# Patient Record
Sex: Male | Born: 1995
Health system: Southern US, Community
[De-identification: ages and names within clinical notes are randomized; demographics above are authoritative.]

## PROBLEM LIST (undated history)

## (undated) DIAGNOSIS — L709 Acne, unspecified: Secondary | ICD-10-CM

## (undated) DIAGNOSIS — Z8619 Personal history of other infectious and parasitic diseases: Secondary | ICD-10-CM

## (undated) HISTORY — PX: NO PAST SURGERIES: SHX2092

## (undated) HISTORY — DX: Personal history of other infectious and parasitic diseases: Z86.19

## (undated) HISTORY — DX: Acne, unspecified: L70.9

---
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
## 2017-02-16 DIAGNOSIS — M79671 Pain in right foot: Secondary | ICD-10-CM | POA: Diagnosis not present

## 2017-06-05 DIAGNOSIS — Z23 Encounter for immunization: Secondary | ICD-10-CM | POA: Diagnosis not present

## 2017-08-31 DIAGNOSIS — H5213 Myopia, bilateral: Secondary | ICD-10-CM | POA: Diagnosis not present

## 2017-10-21 ENCOUNTER — Ambulatory Visit (INDEPENDENT_AMBULATORY_CARE_PROVIDER_SITE_OTHER): Payer: 59 | Admitting: Urgent Care

## 2017-10-21 ENCOUNTER — Encounter: Payer: Self-pay | Admitting: Urgent Care

## 2017-10-21 VITALS — BP 120/70 | HR 66 | Temp 98.2°F | Resp 16 | Ht 65.0 in | Wt 129.2 lb

## 2017-10-21 DIAGNOSIS — Z23 Encounter for immunization: Secondary | ICD-10-CM

## 2017-10-21 DIAGNOSIS — Z8249 Family history of ischemic heart disease and other diseases of the circulatory system: Secondary | ICD-10-CM

## 2017-10-21 DIAGNOSIS — Z1329 Encounter for screening for other suspected endocrine disorder: Secondary | ICD-10-CM | POA: Diagnosis not present

## 2017-10-21 DIAGNOSIS — Z113 Encounter for screening for infections with a predominantly sexual mode of transmission: Secondary | ICD-10-CM | POA: Diagnosis not present

## 2017-10-21 DIAGNOSIS — Z13228 Encounter for screening for other metabolic disorders: Secondary | ICD-10-CM

## 2017-10-21 DIAGNOSIS — R030 Elevated blood-pressure reading, without diagnosis of hypertension: Secondary | ICD-10-CM | POA: Diagnosis not present

## 2017-10-21 DIAGNOSIS — Z130 Encounter for screening for diseases of the blood and blood-forming organs and certain disorders involving the immune mechanism: Secondary | ICD-10-CM

## 2017-10-21 DIAGNOSIS — Z1321 Encounter for screening for nutritional disorder: Secondary | ICD-10-CM | POA: Diagnosis not present

## 2017-10-21 DIAGNOSIS — Z0000 Encounter for general adult medical examination without abnormal findings: Secondary | ICD-10-CM

## 2017-10-21 NOTE — Patient Instructions (Signed)
Health Maintenance, Male A healthy lifestyle and preventive care is important for your health and wellness. Ask your health care provider about what schedule of regular examinations is right for you. What should I know about weight and diet? Eat a Healthy Diet  Eat plenty of vegetables, fruits, whole grains, low-fat dairy products, and lean protein.  Do not eat a lot of foods high in solid fats, added sugars, or salt.  Maintain a Healthy Weight Regular exercise can help you achieve or maintain a healthy weight. You should:  Do at least 150 minutes of exercise each week. The exercise should increase your heart rate and make you sweat (moderate-intensity exercise).  Do strength-training exercises at least twice a week.  Watch Your Levels of Cholesterol and Blood Lipids  Have your blood tested for lipids and cholesterol every 5 years starting at 20 years of age. If you are at high risk for heart disease, you should start having your blood tested when you are 20 years old. You may need to have your cholesterol levels checked more often if: ? Your lipid or cholesterol levels are high. ? You are older than 20 years of age. ? You are at high risk for heart disease.  What should I know about cancer screening? Many types of cancers can be detected early and may often be prevented. Lung Cancer  You should be screened every year for lung cancer if: ? You are a current smoker who has smoked for at least 30 years. ? You are a former smoker who has quit within the past 15 years.  Talk to your health care provider about your screening options, when you should start screening, and how often you should be screened.  Colorectal Cancer  Routine colorectal cancer screening usually begins at 20 years of age and should be repeated every 5-10 years until you are 20 years old. You may need to be screened more often if early forms of precancerous polyps or small growths are found. Your health care provider  may recommend screening at an earlier age if you have risk factors for colon cancer.  Your health care provider may recommend using home test kits to check for hidden blood in the stool.  A small camera at the end of a tube can be used to examine your colon (sigmoidoscopy or colonoscopy). This checks for the earliest forms of colorectal cancer.  Prostate and Testicular Cancer  Depending on your age and overall health, your health care provider may do certain tests to screen for prostate and testicular cancer.  Talk to your health care provider about any symptoms or concerns you have about testicular or prostate cancer.  Skin Cancer  Check your skin from head to toe regularly.  Tell your health care provider about any new moles or changes in moles, especially if: ? There is a change in a mole's size, shape, or color. ? You have a mole that is larger than a pencil eraser.  Always use sunscreen. Apply sunscreen liberally and repeat throughout the day.  Protect yourself by wearing long sleeves, pants, a wide-brimmed hat, and sunglasses when outside.  What should I know about heart disease, diabetes, and high blood pressure?  If you are 18-39 years of age, have your blood pressure checked every 3-5 years. If you are 40 years of age or older, have your blood pressure checked every year. You should have your blood pressure measured twice-once when you are at a hospital or clinic, and once when   you are not at a hospital or clinic. Record the average of the two measurements. To check your blood pressure when you are not at a hospital or clinic, you can use: ? An automated blood pressure machine at a pharmacy. ? A home blood pressure monitor.  Talk to your health care provider about your target blood pressure.  If you are between 45-79 years old, ask your health care provider if you should take aspirin to prevent heart disease.  Have regular diabetes screenings by checking your fasting blood  sugar level. ? If you are at a normal weight and have a low risk for diabetes, have this test once every three years after the age of 45. ? If you are overweight and have a high risk for diabetes, consider being tested at a younger age or more often.  A one-time screening for abdominal aortic aneurysm (AAA) by ultrasound is recommended for men aged 65-75 years who are current or former smokers. What should I know about preventing infection? Hepatitis B If you have a higher risk for hepatitis B, you should be screened for this virus. Talk with your health care provider to find out if you are at risk for hepatitis B infection. Hepatitis C Blood testing is recommended for:  Everyone born from 1945 through 1965.  Anyone with known risk factors for hepatitis C.  Sexually Transmitted Diseases (STDs)  You should be screened each year for STDs including gonorrhea and chlamydia if: ? You are sexually active and are younger than 20 years of age. ? You are older than 20 years of age and your health care provider tells you that you are at risk for this type of infection. ? Your sexual activity has changed since you were last screened and you are at an increased risk for chlamydia or gonorrhea. Ask your health care provider if you are at risk.  Talk with your health care provider about whether you are at high risk of being infected with HIV. Your health care provider may recommend a prescription medicine to help prevent HIV infection.  What else can I do?  Schedule regular health, dental, and eye exams.  Stay current with your vaccines (immunizations).  Do not use any tobacco products, such as cigarettes, chewing tobacco, and e-cigarettes. If you need help quitting, ask your health care provider.  Limit alcohol intake to no more than 2 drinks per day. One drink equals 12 ounces of beer, 5 ounces of wine, or 1 ounces of hard liquor.  Do not use street drugs.  Do not share needles.  Ask your  health care provider for help if you need support or information about quitting drugs.  Tell your health care provider if you often feel depressed.  Tell your health care provider if you have ever been abused or do not feel safe at home. This information is not intended to replace advice given to you by your health care provider. Make sure you discuss any questions you have with your health care provider. Document Released: 08/30/2007 Document Revised: 10/31/2015 Document Reviewed: 12/05/2014 Elsevier Interactive Patient Education  2018 Elsevier Inc.     IF you received an x-ray today, you will receive an invoice from [facility]. Please contact [facility] at [phone] with questions or concerns regarding your invoice.   IF you received labwork today, you will receive an invoice from [facility]. Please contact [facility] at [phone] with questions or concerns regarding your invoice.   Our billing staff will not be   able to assist you with questions regarding bills from these companies.  You will be contacted with the lab results as soon as they are available. The fastest way to get your results is to activate your My Chart account. Instructions are located on the last page of this paperwork. If you have not heard from us regarding the results in 2 weeks, please contact this office.       

## 2017-10-21 NOTE — Progress Notes (Signed)
MRN: [id number]  Subjective:   Mr. Adam Gray is a 22 y.o. male presenting for annual physical exam.  Patient is going to college and his career will be in the [employer].  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  He presents forms to complete for his schooling to join the [employer].  Denies smoking cigarettes or drink alcohol.  Does not use any drugs.  Medical care team includes: PCP: Patient, No Pcp Per Vision: Denies visual deficits. Dental: Gets consistent dental care. Specialists: None. Health Maintenance: Immunizations are up-to-date.  Adam Gray is not currently taking any medications.  He has No Known Allergies. Adam Gray  has a past medical history of Acne and History of shingles.  Denies past surgical history.  His family history includes Asthma in his mother; Cancer in his maternal grandmother; Diabetes in his father; Hypertension in his father and mother; Uterine cancer in his maternal grandmother.  Review of Systems  Constitutional: Negative for chills, diaphoresis, fever, malaise/fatigue and weight loss.  HENT: Negative for congestion, ear discharge, ear pain, hearing loss, nosebleeds, sore throat and tinnitus.   Eyes: Negative for blurred vision, double vision, photophobia, pain, discharge and redness.  Respiratory: Negative for cough, shortness of breath and wheezing.   Cardiovascular: Negative for chest pain, palpitations and leg swelling.  Gastrointestinal: Negative for abdominal pain, blood in stool, constipation, diarrhea, nausea and vomiting.  Genitourinary: Negative for dysuria, flank pain, frequency, hematuria and urgency.  Musculoskeletal: Negative for back pain, joint pain and myalgias.  Skin: Negative for itching and rash.  Neurological: Negative for dizziness, tingling, seizures, loss of consciousness, weakness and headaches.  Endo/Heme/Allergies: Negative for polydipsia.  Psychiatric/Behavioral: Negative for depression, hallucinations,  memory loss, substance abuse and suicidal ideas. The patient is not nervous/anxious and does not have insomnia.     Objective:   Vitals: BP (!) 142/77   Pulse 66   Temp 98.2 F (36.8 C) (Oral)   Resp 16   Ht 5\' 5"  (1.651 m)   Wt 129 lb 3.2 oz (58.6 kg)   SpO2 98%   BMI 21.50 kg/m    Visual Acuity Screening   Right eye Left eye Both eyes  Without correction: 20/70 20/30-1 20/25-1  With correction:       Physical Exam  Constitutional: He is oriented to person, place, and time. He appears well-developed and well-nourished.  HENT:  TM's intact bilaterally, no effusions or erythema. Nasal turbinates pink and moist, nasal passages patent. No sinus tenderness. Oropharynx clear, mucous membranes moist, dentition in good repair.  Eyes: Pupils are equal, round, and reactive to light. Conjunctivae and EOM are normal. Right eye exhibits no discharge. Left eye exhibits no discharge. No scleral icterus.  Neck: Normal range of motion. Neck supple. No thyromegaly present.  Cardiovascular: Normal rate, regular rhythm, normal heart sounds and intact distal pulses. Exam reveals no gallop and no friction rub.  No murmur heard. Pulmonary/Chest: Effort normal and breath sounds normal. No stridor. No respiratory distress. He has no wheezes. He has no rales.  Abdominal: Soft. Bowel sounds are normal. He exhibits no distension and no mass. There is no tenderness. There is no rebound and no guarding.  Musculoskeletal: Normal range of motion. He exhibits no edema or tenderness.  Lymphadenopathy:    He has no cervical adenopathy.  Neurological: He is alert and oriented to person, place, and time. He has normal reflexes. He displays normal reflexes. Coordination normal.  Skin: Skin is warm and dry.  Capillary refill takes less than 2 seconds. No rash noted. No erythema. No pallor.  Psychiatric: He has a normal mood and affect.    Assessment and Plan :   Annual physical exam - Plan: CBC, Comprehensive  metabolic panel, Lipid panel, TSH  Screen for STD (sexually transmitted disease) - Plan: HIV antibody  Screening for endocrine, nutritional, metabolic and immunity disorder  Family history of hypertension  Need for Tdap vaccination - Plan: Tdap vaccine greater than or equal to 7yo IM  Elevated blood pressure reading - Plan: CBC, Comprehensive metabolic panel, TSH  Patient denies history of high blood pressure.  Labs pending.  He is otherwise stable, medically pleasant and motivated young man.  Forms for his school were completed. Discussed healthy lifestyle, diet, exercise, preventative care, vaccinations, and addressed patient's concerns.    Wallis Bamberg, PA-C [facility] at [facility] [phone] 10/21/2017  1:55 PM

## 2017-10-22 LAB — COMPREHENSIVE METABOLIC PANEL
A/G RATIO: 1.7 (ref 1.2–2.2)
ALBUMIN: 4.8 g/dL (ref 3.5–5.5)
ALT: 23 IU/L (ref 0–44)
AST: 30 IU/L (ref 0–40)
Alkaline Phosphatase: 88 IU/L (ref 39–117)
BILIRUBIN TOTAL: 0.4 mg/dL (ref 0.0–1.2)
BUN/Creatinine Ratio: 13 (ref 9–20)
BUN: 13 mg/dL (ref 6–20)
CHLORIDE: 102 mmol/L (ref 96–106)
CO2: 24 mmol/L (ref 20–29)
Calcium: 10.3 mg/dL — ABNORMAL HIGH (ref 8.7–10.2)
Creatinine, Ser: 1.02 mg/dL (ref 0.76–1.27)
GFR calc non Af Amer: 105 mL/min/{1.73_m2} (ref >59)
GFR, EST AFRICAN AMERICAN: 122 mL/min/{1.73_m2} (ref >59)
Globulin, Total: 2.8 g/dL (ref 1.5–4.5)
Glucose: 83 mg/dL (ref 65–99)
POTASSIUM: 4.6 mmol/L (ref 3.5–5.2)
Sodium: 141 mmol/L (ref 134–144)
TOTAL PROTEIN: 7.6 g/dL (ref 6.0–8.5)

## 2017-10-22 LAB — CBC
Hematocrit: 45.7 % (ref 37.5–51.0)
Hemoglobin: 15.5 g/dL (ref 13.0–17.7)
MCH: 30.2 pg (ref 26.6–33.0)
MCHC: 33.9 g/dL (ref 31.5–35.7)
MCV: 89 fL (ref 79–97)
PLATELETS: 204 10*3/uL (ref 150–450)
RBC: 5.14 x10E6/uL (ref 4.14–5.80)
RDW: 14.3 % (ref 12.3–15.4)
WBC: 5.5 10*3/uL (ref 3.4–10.8)

## 2017-10-22 LAB — LIPID PANEL
CHOLESTEROL TOTAL: 160 mg/dL (ref 100–199)
Chol/HDL Ratio: 3 ratio (ref 0.0–5.0)
HDL: 54 mg/dL (ref >39)
LDL Calculated: 96 mg/dL (ref 0–99)
Triglycerides: 52 mg/dL (ref 0–149)
VLDL Cholesterol Cal: 10 mg/dL (ref 5–40)

## 2017-10-22 LAB — TSH: TSH: 1.15 u[IU]/mL (ref 0.450–4.500)

## 2017-10-22 LAB — HIV ANTIBODY (ROUTINE TESTING W REFLEX): HIV Screen 4th Generation wRfx: NONREACTIVE

## 2018-07-23 ENCOUNTER — Ambulatory Visit (INDEPENDENT_AMBULATORY_CARE_PROVIDER_SITE_OTHER): Payer: 59 | Admitting: Registered Nurse

## 2018-07-23 ENCOUNTER — Encounter: Payer: Self-pay | Admitting: Registered Nurse

## 2018-07-23 ENCOUNTER — Other Ambulatory Visit: Payer: Self-pay

## 2018-07-23 VITALS — BP 126/72 | HR 54 | Temp 98.0°F | Resp 18 | Ht 65.16 in | Wt 128.0 lb

## 2018-07-23 DIAGNOSIS — Z0000 Encounter for general adult medical examination without abnormal findings: Secondary | ICD-10-CM

## 2018-07-23 DIAGNOSIS — Z23 Encounter for immunization: Secondary | ICD-10-CM

## 2018-07-23 DIAGNOSIS — R9431 Abnormal electrocardiogram [ECG] [EKG]: Secondary | ICD-10-CM

## 2018-07-23 NOTE — Progress Notes (Signed)
New Patient Office Visit  Subjective:  Patient ID: Adam Gray, male    DOB: [birth year]  Age: 23 y.o. MRN: [id number]  CC:  Chief Complaint  Patient presents with  . 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 Acne   . History of shingles     History reviewed. No pertinent surgical history.  Family History  Problem Relation Age of Onset  . Hypertension Mother   . Asthma Mother   . Hypertension Father   . Diabetes Father   . Cancer Maternal Grandmother   . Uterine cancer Maternal Grandmother     Social History   Socioeconomic History  . Marital status: Single    Spouse name: Not on file  . Number of children: Not on file  . Years of education: Not on file  . Highest education level: Not on file  Occupational History  . Not on file  Social Needs  . Financial resource strain: Not on file  . Food insecurity:    Worry: Not on file    Inability: Not on file  . Transportation needs:    Medical: Not on file    Non-medical: Not on file  Tobacco Use  . Smoking status: Never Smoker  . Smokeless tobacco: Never Used  Substance and Sexual Activity  . Alcohol use: No    Alcohol/week: 0.0 standard drinks  . Drug use: No  . Sexual activity: Not on file  Lifestyle  . Physical activity:    Days per week: Not on file    Minutes per session: Not on file  . Stress: Not on file  Relationships  . Social connections:    Talks on phone: Not on file    Gets together: Not on file    Attends religious service: Not on file    Active member of club or organization: Not on file    Attends meetings of clubs or organizations: Not on file    Relationship status: Not on file  . Intimate partner violence:    Fear of current or ex partner: Not on file    Emotionally abused: Not on file    Physically abused: Not on file    Forced sexual activity: Not on file  Other Topics Concern   . Not on file  Social History Narrative   ** Merged History Encounter **        ROS Review of Systems  Constitutional: Negative.   HENT: Negative.   Eyes: Negative.   Respiratory: Negative.   Cardiovascular: Negative.   Gastrointestinal: Negative.   Endocrine: Negative.   Genitourinary: Negative.   Musculoskeletal: Negative.   Skin: Negative.   Allergic/Immunologic: Negative.   Neurological: Negative.   Hematological: Negative.   Psychiatric/Behavioral: Negative.   All other systems reviewed and are negative.   Objective:   Today's Vitals: BP 126/72   Pulse (!) 54   Temp 98 F (36.7 C) (Oral)   Resp 18   Ht 5' 5.16" (1.655 m)   Wt 128 lb (58.1 kg)   SpO2 98%   BMI 21.20 kg/m   Physical Exam Constitutional:      General: He is not in acute distress.    Appearance: Normal appearance. He is obese. He is not ill-appearing, toxic-appearing or diaphoretic.  HENT:     Head: Normocephalic and atraumatic.     Right Ear: Tympanic membrane, ear  canal and external ear normal. There is no impacted cerumen.     Left Ear: Tympanic membrane, ear canal and external ear normal. There is no impacted cerumen.     Nose: Nose normal. No congestion or rhinorrhea.     Mouth/Throat:     Mouth: Mucous membranes are dry.     Pharynx: Oropharynx is clear. No oropharyngeal exudate or posterior oropharyngeal erythema.  Eyes:     General: No scleral icterus.       Right eye: No discharge.        Left eye: No discharge.     Extraocular Movements: Extraocular movements intact.     Conjunctiva/sclera: Conjunctivae normal.     Pupils: Pupils are equal, round, and reactive to light.  Neck:     Musculoskeletal: Normal range of motion and neck supple. No neck rigidity or muscular tenderness.     Vascular: No carotid bruit.  Cardiovascular:     Rate and Rhythm: Regular rhythm. Bradycardia present.     Pulses:          Carotid pulses are 2+ on the right side and 2+ on the left side.       Radial pulses are 2+ on the right side and 2+ on the left side.       Femoral pulses are 2+ on the right side and 2+ on the left side.      Dorsalis pedis pulses are 2+ on the right side and 2+ on the left side.     Heart sounds: Normal heart sounds, S1 normal and S2 normal. No murmur. No friction rub. No gallop.   Pulmonary:     Effort: Pulmonary effort is normal. No respiratory distress.     Breath sounds: Normal breath sounds. No stridor. No wheezing, rhonchi or rales.  Chest:     Chest wall: No tenderness.  Abdominal:     General: Abdomen is flat. Bowel sounds are normal. There is no distension.     Palpations: Abdomen is soft. There is no mass.     Tenderness: There is no abdominal tenderness. There is no right CVA tenderness, left CVA tenderness, guarding or rebound.     Hernia: No hernia is present.  Genitourinary:    Penis: Normal.      Scrotum/Testes: Normal.     Rectum: Normal.  Musculoskeletal: Normal range of motion.        General: No swelling, tenderness, deformity or signs of injury.     Right lower leg: No edema.     Left lower leg: No edema.  Lymphadenopathy:     Cervical: No cervical adenopathy.  Skin:    General: Skin is warm and dry.     Capillary Refill: Capillary refill takes less than 2 seconds.     Coloration: Skin is not jaundiced or pale.     Findings: No bruising, erythema, lesion or rash.  Neurological:     General: No focal deficit present.     Mental Status: He is alert. Mental status is at baseline. He is disoriented.     Cranial Nerves: No cranial nerve deficit.     Sensory: No sensory deficit.     Motor: No weakness.     Coordination: Coordination normal.     Gait: Gait normal.     Deep Tendon Reflexes: Reflexes normal.  Psychiatric:        Mood and Affect: Mood normal.        Behavior: Behavior normal.  Thought Content: Thought content normal.        Judgment: Judgment normal.     Assessment & Plan:   Problem List Items Addressed  This Visit    None    Visit Diagnoses    Annual physical exam    -  Primary   Relevant Orders   CBC with Differential/Platelet   Comprehensive metabolic panel   Lipid panel   EKG 12-Lead (Completed)   HIV Antibody (routine testing w rflx)   DG Chest 2 View   Need for meningococcal vaccination       Relevant Orders   Meningococcal conjugate vaccine 4-valent IM (Completed)   Need for HPV vaccination       Relevant Orders   HPV 9-valent vaccine,Recombinat (Completed)   Need for Tdap vaccination       Relevant Orders   Tdap vaccine greater than or equal to 7yo IM (Completed)   Need for prophylactic vaccination and inoculation against viral hepatitis       Relevant Orders   Hepatitis A vaccine adult IM (Completed)   Shortened PR interval       Relevant Orders   Ambulatory referral to Cardiology      No outpatient encounter medications on file as of 07/23/2018.   No facility-administered encounter medications on file as of 07/23/2018.    PLAN:  Pt has normal physical exam.  EKG in office shows shortened PR. We discussed the risk for Lown-Ganong-Levine and why it's important to receive cardiac clearance before signing off on this form.  Pt referred to cardiology  Pt given all appropriate vaccinations and testing  Patient encouraged to call clinic with any questions, comments, or concerns.   Follow-up: No follow-ups on file.   Janeece Agee, NP

## 2018-07-23 NOTE — Patient Instructions (Signed)
° ° ° °  If you have lab work done today you will be contacted with your lab results within the next 2 weeks.  If you have not heard from us then please contact us. The fastest way to get your results is to register for My Chart. ° ° °IF you received an x-ray today, you will receive an invoice from [facility]. Please contact [facility] at [phone] with questions or concerns regarding your invoice.  ° °IF you received labwork today, you will receive an invoice from [facility]. Please contact [facility] at [phone] with questions or concerns regarding your invoice.  ° °Our billing staff will not be able to assist you with questions regarding bills from these companies. ° °You will be contacted with the lab results as soon as they are available. The fastest way to get your results is to activate your My Chart account. Instructions are located on the last page of this paperwork. If you have not heard from us regarding the results in 2 weeks, please contact this office. °  ° ° ° °

## 2018-07-24 LAB — COMPREHENSIVE METABOLIC PANEL
ALT: 20 IU/L (ref 0–44)
AST: 21 IU/L (ref 0–40)
Albumin/Globulin Ratio: 1.7 (ref 1.2–2.2)
Albumin: 4.6 g/dL (ref 4.1–5.2)
Alkaline Phosphatase: 76 IU/L (ref 39–117)
BUN/Creatinine Ratio: 16 (ref 9–20)
BUN: 14 mg/dL (ref 6–20)
Bilirubin Total: 0.7 mg/dL (ref 0.0–1.2)
CO2: 21 mmol/L (ref 20–29)
Calcium: 10 mg/dL (ref 8.7–10.2)
Chloride: 101 mmol/L (ref 96–106)
Creatinine, Ser: 0.86 mg/dL (ref 0.76–1.27)
GFR calc Af Amer: 144 mL/min/{1.73_m2} (ref >59)
GFR calc non Af Amer: 125 mL/min/{1.73_m2} (ref >59)
Globulin, Total: 2.7 g/dL (ref 1.5–4.5)
Glucose: 79 mg/dL (ref 65–99)
Potassium: 4.3 mmol/L (ref 3.5–5.2)
Sodium: 139 mmol/L (ref 134–144)
Total Protein: 7.3 g/dL (ref 6.0–8.5)

## 2018-07-24 LAB — CBC WITH DIFFERENTIAL/PLATELET
Basophils Absolute: 0 10*3/uL (ref 0.0–0.2)
Basos: 0 %
EOS (ABSOLUTE): 0 10*3/uL (ref 0.0–0.4)
Eos: 0 %
Hematocrit: 43.8 % (ref 37.5–51.0)
Hemoglobin: 15.1 g/dL (ref 13.0–17.7)
Immature Grans (Abs): 0 10*3/uL (ref 0.0–0.1)
Immature Granulocytes: 0 %
Lymphocytes Absolute: 1.3 10*3/uL (ref 0.7–3.1)
Lymphs: 20 %
MCH: 30 pg (ref 26.6–33.0)
MCHC: 34.5 g/dL (ref 31.5–35.7)
MCV: 87 fL (ref 79–97)
Monocytes Absolute: 0.5 10*3/uL (ref 0.1–0.9)
Monocytes: 8 %
Neutrophils Absolute: 4.8 10*3/uL (ref 1.4–7.0)
Neutrophils: 72 %
Platelets: 170 10*3/uL (ref 150–450)
RBC: 5.03 x10E6/uL (ref 4.14–5.80)
RDW: 13 % (ref 11.6–15.4)
WBC: 6.6 10*3/uL (ref 3.4–10.8)

## 2018-07-24 LAB — LIPID PANEL
Chol/HDL Ratio: 2.7 ratio (ref 0.0–5.0)
Cholesterol, Total: 137 mg/dL (ref 100–199)
HDL: 51 mg/dL (ref >39)
LDL Calculated: 77 mg/dL (ref 0–99)
Triglycerides: 43 mg/dL (ref 0–149)
VLDL Cholesterol Cal: 9 mg/dL (ref 5–40)

## 2018-07-24 LAB — HIV ANTIBODY (ROUTINE TESTING W REFLEX): HIV Screen 4th Generation wRfx: NONREACTIVE

## 2018-07-26 ENCOUNTER — Emergency Department (HOSPITAL_BASED_OUTPATIENT_CLINIC_OR_DEPARTMENT_OTHER)
Admission: EM | Admit: 2018-07-26 | Discharge: 2018-07-26 | Disposition: A | Discharge status: Home / Self Care | Payer: 59 | Attending: Emergency Medicine | Admitting: Emergency Medicine

## 2018-07-26 ENCOUNTER — Other Ambulatory Visit: Payer: Self-pay

## 2018-07-26 ENCOUNTER — Encounter (HOSPITAL_BASED_OUTPATIENT_CLINIC_OR_DEPARTMENT_OTHER): Payer: Self-pay | Admitting: *Deleted

## 2018-07-26 DIAGNOSIS — R9431 Abnormal electrocardiogram [ECG] [EKG]: Secondary | ICD-10-CM | POA: Insufficient documentation

## 2018-07-26 DIAGNOSIS — I456 Pre-excitation syndrome: Secondary | ICD-10-CM | POA: Diagnosis not present

## 2018-07-26 DIAGNOSIS — F419 Anxiety disorder, unspecified: Secondary | ICD-10-CM | POA: Diagnosis not present

## 2018-07-26 DIAGNOSIS — R001 Bradycardia, unspecified: Secondary | ICD-10-CM | POA: Diagnosis not present

## 2018-07-26 NOTE — ED Triage Notes (Signed)
PE done on Friday for [employer], short PR on EKG , needs new ekg, denies Chest pain

## 2018-07-26 NOTE — ED Provider Notes (Signed)
[facility] EMERGENCY DEPARTMENT Provider Note   CSN: [id number] Arrival date & time: 07/26/18  1802    History   Chief Complaint Chief Complaint  Patient presents with  . needs EKG    HPI Adam Gray is a 23 y.o. male.     23 year old healthy male who presents for abnormal EKG.  The patient is currently in [employer] and is applying for the [employer].  He had a routine physical exam during which an EKG was obtained.  He was told that he had a short PR interval on EKG and he would need cardiology evaluation.  He presents here for repeat EKG and second opinion.  He denies any complaints whatsoever including no recent illness, chest pain, shortness of breath, exercise intolerance, dizziness/lightheadedness, syncope, or palpitations.  He has no history of heart problems.  He runs 6 to 7 miles per day and is very physically active with no problems.  No drug use.  The history is provided by the patient and a parent.    Past Medical History:  Diagnosis Date  . Acne   . History of shingles     There are no active problems to display for this patient.   History reviewed. No pertinent surgical history.      Home Medications    Prior to Admission medications   Not on File    Family History Family History  Problem Relation Age of Onset  . Hypertension Mother   . Asthma Mother   . Hypertension Father   . Diabetes Father   . Cancer Maternal Grandmother   . Uterine cancer Maternal Grandmother     Social History Social History   Tobacco Use  . Smoking status: Never Smoker  . Smokeless tobacco: Never Used  Substance Use Topics  . Alcohol use: No    Alcohol/week: 0.0 standard drinks  . Drug use: No     Allergies   Patient has no known allergies.   Review of Systems Review of Systems All other systems reviewed and are negative except that which was mentioned in HPI   Physical Exam Updated Vital Signs BP (!) 149/69   Pulse (!) 59   Temp 98 F  (36.7 C)   Resp 18   Ht 5\' 5"  (1.651 m)   Wt 58 kg   SpO2 100%   BMI 21.28 kg/m   Physical Exam Vitals signs and nursing note reviewed.  Constitutional:      General: He is not in acute distress.    Appearance: He is well-developed.  HENT:     Head: Normocephalic and atraumatic.     Nose: Nose normal.  Eyes:     Conjunctiva/sclera: Conjunctivae normal.  Neck:     Musculoskeletal: Neck supple.  Cardiovascular:     Rate and Rhythm: Regular rhythm. Bradycardia present.     Heart sounds: Normal heart sounds. No murmur.  Pulmonary:     Effort: Pulmonary effort is normal.     Breath sounds: Normal breath sounds.  Abdominal:     General: There is no distension.  Musculoskeletal:     Right lower leg: No edema.     Left lower leg: No edema.  Skin:    General: Skin is warm and dry.  Neurological:     Mental Status: He is alert and oriented to person, place, and time.     Comments: Fluent speech  Psychiatric:        Judgment: Judgment normal.  Comments: Mildly anxious      ED Treatments / Results  Labs (all labs ordered are listed, but only abnormal results are displayed) Labs Reviewed - No data to display  EKG EKG Interpretation  Date/Time:  Monday Jul 26 2018 18:16:30 EDT Ventricular Rate:  62 PR Interval:    QRS Duration: 100 QT Interval:  411 QTC Calculation: 418 R Axis:   90 Text Interpretation:  Sinus rhythm Short PR interval Borderline right axis deviation Baseline wander in lead(s) V3 V4 V5 No previous ECGs available Confirmed by Frederick Peers [phone]) on 07/26/2018 6:19:15 PM   Radiology No results found.  Procedures Procedures (including critical care time)  Medications Ordered in ED Medications - No data to display   Initial Impression / Assessment and Plan / ED Course  I have reviewed the triage vital signs and the nursing notes.  Pertinent labs & imaging results that were available during my care of the patient were reviewed by me and  considered in my medical decision making (see chart for details).        Pt well appearing on exam, sinus bradycardia on EKG w/ rate ~60 likely 2/2 good physical condition and young age. His EKG here has short PR interval with no evidence of QT prolongation, brugada, WPW, ST elevation, LVH, or other concerning findings. I explained that from my perspective, short PR alone with no other EKG abnormalities and no symptoms whatsoever is not a clinically significant finding. As he is totally asymptomatic, I have advised he can return to his regular physical activity as tolerated. They plan to set up cardiology evaluation via telemedicine to review EKG and discuss any further w/u needed.   Final Clinical Impressions(s) / ED Diagnoses   Final diagnoses:  None    ED Discharge Orders    None       Little, Ambrose Finland, MD 07/26/18 [phone]

## 2018-07-26 NOTE — Discharge Instructions (Addendum)
Aside from a shortened PR interval, your EKG is otherwise within normal limits for your age group. From my perspective as an ER physician, this finding does not require any further work up as long as you are not having any concerning symptoms (such as chest pain, shortness of breath, syncope, near syncope, or palpitations.) You are clear to return to your normal physical activity with no restrictions.

## 2018-07-29 ENCOUNTER — Ambulatory Visit (HOSPITAL_BASED_OUTPATIENT_CLINIC_OR_DEPARTMENT_OTHER)
Admission: RE | Admit: 2018-07-29 | Discharge: 2018-07-29 | Disposition: A | Discharge status: Home / Self Care | Payer: 59 | Source: Ambulatory Visit | Attending: Registered Nurse | Admitting: Registered Nurse

## 2018-07-29 ENCOUNTER — Other Ambulatory Visit: Payer: Self-pay

## 2018-07-29 DIAGNOSIS — Z0000 Encounter for general adult medical examination without abnormal findings: Secondary | ICD-10-CM | POA: Diagnosis not present

## 2018-07-29 DIAGNOSIS — Z01818 Encounter for other preprocedural examination: Secondary | ICD-10-CM | POA: Diagnosis not present

## 2018-08-06 ENCOUNTER — Encounter: Payer: Self-pay | Admitting: *Deleted

## 2018-08-06 ENCOUNTER — Encounter: Payer: Self-pay | Admitting: Cardiology

## 2018-08-06 ENCOUNTER — Telehealth (INDEPENDENT_AMBULATORY_CARE_PROVIDER_SITE_OTHER): Payer: 59 | Admitting: Cardiology

## 2018-08-06 VITALS — Ht 65.0 in | Wt 127.0 lb

## 2018-08-06 DIAGNOSIS — R9431 Abnormal electrocardiogram [ECG] [EKG]: Secondary | ICD-10-CM | POA: Diagnosis not present

## 2018-08-06 NOTE — Progress Notes (Signed)
Virtual Visit via Video Note   This visit type was conducted due to national recommendations for restrictions regarding the COVID-19 Pandemic (e.g. social distancing) in an effort to limit this patient's exposure and mitigate transmission in our community.  Due to his co-morbid illnesses, this patient is at least at moderate risk for complications without adequate follow up.  This format is felt to be most appropriate for this patient at this time.  All issues noted in this document were discussed and addressed.  A limited physical exam was performed with this format.  Please refer to the patient's chart for his consent to telehealth for Adam Gray.   Date:  08/06/2018   ID:  Adam Gray, DOB Dec 03, 1995, MRN [id number]  Patient Location: Home Provider Location: Home  PCP:  Patient, No Pcp Per  Cardiologist:  Donato Schultz, MD  Electrophysiologist:  None   Evaluation Performed:  Consultation - Adam Gray was referred by Janeece Agee, NP for the evaluation of short PR interval.  Chief Complaint: Evaluation of short PR interval  History of Present Illness:    Adam Gray is a 23 y.o. 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  Presented to the ER on 07/26/2018 with abnormal EKG.  He is in [place] and is applying for the [employer].  During this routine physical exam an EKG was obtained and he was told that he had a short PR interval and would need cardiology evaluation.  Went to the ER for a second opinion.  He is doing very well without any symptoms no chest pain fevers chills nausea vomiting syncope exercise tolerance issues.  No early family history of sudden cardiac death.  He runs about 6 miles a day very physically active.  Non-smoker, no significant alcohol use, no supplements no stimulants.  He is in excellent health.  The patient does not have symptoms concerning for COVID-19 infection (fever, chills, cough, or new shortness of breath).     Past Medical History:  Diagnosis Date  . Acne   . History of shingles    No past surgical history on file.   No outpatient medications have been marked as taking for the 08/06/18 encounter (Telemedicine) with Jake Bathe, MD.     Allergies:   Patient has no known allergies.   Social History   Tobacco Use  . Smoking status: Never Smoker  . Smokeless tobacco: Never Used  Substance Use Topics  . Alcohol use: No    Alcohol/week: 0.0 standard drinks  . Drug use: No     Family Hx: The patient's family history includes Asthma in his mother; Cancer in his maternal grandmother; Diabetes in his father; Hypertension in his father and mother; Uterine cancer in his maternal grandmother.  ROS:   Please see the history of present illness.    Denies any fevers chills nausea vomiting syncope bleeding All other systems reviewed and are negative.   Prior CV studies:   The following studies were reviewed today:  Prior EKGs  Labs/Other Tests and Data Reviewed:    EKG:  An ECG dated 07/26/2018 was personally reviewed today and demonstrated:  Sinus rhythm, short PR interval, otherwise unremarkable, normal QT interval.  Recent Labs: 10/21/2017: TSH 1.150 07/23/2018: ALT 20; BUN 14; Creatinine, Ser 0.86; Hemoglobin 15.1; Platelets 170; Potassium 4.3; Sodium 139   Recent Lipid Panel Lab Results  Component Value Date/Time   CHOL 137 07/23/2018 04:22 PM   TRIG 43 07/23/2018  04:22 PM   HDL 51 07/23/2018 04:22 PM   CHOLHDL 2.7 07/23/2018 04:22 PM   LDLCALC 77 07/23/2018 04:22 PM    Wt Readings from Last 3 Encounters:  08/06/18 127 lb (57.6 kg)  07/26/18 127 lb 13.9 oz (58 kg)  07/23/18 128 lb (58.1 kg)     Objective:    Vital Signs:  Ht 5\' 5"  (1.651 m)   Wt 127 lb (57.6 kg)   BMI 21.13 kg/m    VITAL SIGNS:  reviewed GEN:  no acute distress EYES:  sclerae anicteric, EOMI - Extraocular Movements Intact RESPIRATORY:  normal respiratory effort, symmetric expansion SKIN:  no  rash, lesions or ulcers. MUSCULOSKELETAL:  no obvious deformities. NEURO:  alert and oriented x 3, no obvious focal deficit PSYCH:  normal affect  ASSESSMENT & PLAN:    Short PR interval - This is a normal variant.  He does not have any EKG evidence of accessory pathway.  Normal intervals otherwise.  No family history of sudden cardiac death.  He is able to exercise well, vigorously without any issues.  No syncope.  No palpitations. 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  COVID-19 Education: The signs and symptoms of COVID-19 were discussed with the patient and how to seek care for testing (follow up with PCP or arrange E-visit).  The importance of social distancing was discussed today.  Time:   Today, I have spent 18 minutes with the patient with telehealth technology discussing the above problems.     Medication Adjustments/Labs and Tests Ordered: Current medicines are reviewed at length with the patient today.  Concerns regarding medicines are outlined above.   Tests Ordered: No orders of the defined types were placed in this encounter.   Medication Changes: No orders of the defined types were placed in this encounter.   Disposition:  Follow up prn  Signed, Donato SchultzMark Martie Fulgham, MD  08/06/2018 12:46 PM    [facility]

## 2018-08-06 NOTE — Patient Instructions (Signed)
Medication Instructions:  Your physician recommends that you continue on your current medications as directed. Please refer to the Current Medication list given to you today.  If you need a refill on your cardiac medications before your next appointment, please call your pharmacy.   Lab work: None If you have labs (blood work) drawn today and your tests are completely normal, you will receive your results only by: . MyChart Message (if you have MyChart) OR . A paper copy in the mail If you have any lab test that is abnormal or we need to change your treatment, we will call you to review the results.  Testing/Procedures: None  Follow-Up: Your physician recommends that you schedule a follow-up appointment as needed with Dr. Skains.    Any Other Special Instructions Will Be Listed Below (If Applicable).    

## 2018-09-30 DIAGNOSIS — M25311 Other instability, right shoulder: Secondary | ICD-10-CM | POA: Diagnosis not present

## 2018-10-07 ENCOUNTER — Telehealth: Payer: Self-pay | Admitting: Registered Nurse

## 2018-10-07 NOTE — Telephone Encounter (Signed)
Pt dropped of paperwork to be filled out by Maximiano Coss from recent physical . Put paperwork in provider box at CNA station. Please call pt when ready for pick up .   FR

## 2018-10-11 NOTE — Telephone Encounter (Signed)
I spoke to Adam Gray and she stated patient was contacted forms picked up.Marland Kitchen

## 2018-10-11 NOTE — Telephone Encounter (Signed)
Spoke with pt and informed hi, that paper work was ready for pick up, he verbalized understanding.

## 2018-10-19 ENCOUNTER — Other Ambulatory Visit: Payer: Self-pay | Admitting: Orthopedic Surgery

## 2018-10-19 ENCOUNTER — Other Ambulatory Visit (HOSPITAL_COMMUNITY): Payer: Self-pay | Admitting: Orthopedic Surgery

## 2018-10-19 DIAGNOSIS — M25511 Pain in right shoulder: Secondary | ICD-10-CM

## 2018-10-19 DIAGNOSIS — M25311 Other instability, right shoulder: Secondary | ICD-10-CM

## 2018-11-05 ENCOUNTER — Ambulatory Visit (HOSPITAL_COMMUNITY): Payer: 59

## 2018-11-08 ENCOUNTER — Ambulatory Visit (HOSPITAL_COMMUNITY)
Admission: RE | Admit: 2018-11-08 | Discharge: 2018-11-08 | Disposition: A | Discharge status: Home / Self Care | Payer: 59 | Source: Ambulatory Visit | Attending: Orthopedic Surgery | Admitting: Orthopedic Surgery

## 2018-11-08 ENCOUNTER — Other Ambulatory Visit: Payer: Self-pay

## 2018-11-08 DIAGNOSIS — M25311 Other instability, right shoulder: Secondary | ICD-10-CM | POA: Insufficient documentation

## 2018-11-08 DIAGNOSIS — M25511 Pain in right shoulder: Secondary | ICD-10-CM | POA: Insufficient documentation

## 2018-11-08 DIAGNOSIS — S43431A Superior glenoid labrum lesion of right shoulder, initial encounter: Secondary | ICD-10-CM | POA: Diagnosis not present

## 2018-11-08 MED ORDER — IOHEXOL 180 MG/ML  SOLN
20.0000 mL | Freq: Once | INTRAMUSCULAR | Status: AC | PRN
  Administered 2018-11-08: 5.5 mL via INTRA_ARTICULAR

## 2018-11-08 MED ORDER — GADOBENATE DIMEGLUMINE 529 MG/ML IV SOLN
5.0000 mL | Freq: Once | INTRAVENOUS | Status: AC | PRN
  Administered 2018-11-08: 0.1 mL via INTRA_ARTICULAR

## 2018-11-08 MED ORDER — SODIUM CHLORIDE (PF) 0.9 % IJ SOLN
10.0000 mL | Freq: Once | INTRAMUSCULAR | Status: AC
  Administered 2018-11-08: 11:00:00 5.5 mL

## 2018-11-08 MED ORDER — LIDOCAINE HCL (PF) 1 % IJ SOLN
5.0000 mL | Freq: Once | INTRAMUSCULAR | Status: AC
  Administered 2018-11-08: 5 mL via INTRADERMAL

## 2018-11-23 ENCOUNTER — Ambulatory Visit: Payer: 59 | Admitting: Family Medicine

## 2018-11-26 DIAGNOSIS — S43431A Superior glenoid labrum lesion of right shoulder, initial encounter: Secondary | ICD-10-CM | POA: Diagnosis not present

## 2018-11-29 ENCOUNTER — Other Ambulatory Visit: Payer: Self-pay | Admitting: Orthopedic Surgery

## 2018-12-03 ENCOUNTER — Encounter: Payer: Self-pay | Admitting: Registered Nurse

## 2018-12-03 ENCOUNTER — Ambulatory Visit (INDEPENDENT_AMBULATORY_CARE_PROVIDER_SITE_OTHER): Payer: 59 | Admitting: Registered Nurse

## 2018-12-03 ENCOUNTER — Other Ambulatory Visit: Payer: Self-pay

## 2018-12-03 VITALS — BP 126/77 | HR 55 | Temp 98.0°F | Resp 16 | Wt 129.0 lb

## 2018-12-03 DIAGNOSIS — Z20828 Contact with and (suspected) exposure to other viral communicable diseases: Secondary | ICD-10-CM | POA: Diagnosis not present

## 2018-12-03 DIAGNOSIS — Z20822 Contact with and (suspected) exposure to covid-19: Secondary | ICD-10-CM

## 2018-12-03 NOTE — Progress Notes (Signed)
Acute Office Visit  Subjective:    Patient ID: Adam Gray, male    DOB: 1995/04/07, 23 y.o.   MRN: [id number]  Chief Complaint  Patient presents with  . Physical Clearance    pt states he needs a COVID test to go into the [employer]     HPI Patient is in today for COVID-19 testing He has no known exposure He is joining [employer] and they are requiring a test His mother works in healthcare and has had contact with patients who have had COVID-19, but she has not shown symptoms or tested positive.   Past Medical History:  Diagnosis Date  . Acne   . History of shingles     History reviewed. No pertinent surgical history.  Family History  Problem Relation Age of Onset  . Hypertension Mother   . Asthma Mother   . Hypertension Father   . Diabetes Father   . Cancer Maternal Grandmother   . Uterine cancer Maternal Grandmother     Social History   Socioeconomic History  . Marital status: Single    Spouse name: Not on file  . Number of children: Not on file  . Years of education: Not on file  . Highest education level: Not on file  Occupational History  . Not on file  Social Needs  . Financial resource strain: Not on file  . Food insecurity    Worry: Not on file    Inability: Not on file  . Transportation needs    Medical: Not on file    Non-medical: Not on file  Tobacco Use  . Smoking status: Never Smoker  . Smokeless tobacco: Never Used  Substance and Sexual Activity  . Alcohol use: No    Alcohol/week: 0.0 standard drinks  . Drug use: No  . Sexual activity: Not on file  Lifestyle  . Physical activity    Days per week: Not on file    Minutes per session: Not on file  . Stress: Not on file  Relationships  . Social Herbalist on phone: Not on file    Gets together: Not on file    Attends religious service: Not on file    Active member of club or organization: Not on file    Attends meetings of clubs or organizations: Not on file   Relationship status: Not on file  . Intimate partner violence    Fear of current or ex partner: Not on file    Emotionally abused: Not on file    Physically abused: Not on file    Forced sexual activity: Not on file  Other Topics Concern  . Not on file  Social History Narrative   ** Merged History Encounter **        No outpatient medications prior to visit.   No facility-administered medications prior to visit.     No Known Allergies  Review of Systems  Constitutional: Negative.   HENT: Negative.   Eyes: Negative.   Respiratory: Negative.   Cardiovascular: Negative.   Gastrointestinal: Negative.   Genitourinary: Negative.   Musculoskeletal: Negative.   Skin: Negative.   Neurological: Negative.   Endo/Heme/Allergies: Negative.   Psychiatric/Behavioral: Negative.   All other systems reviewed and are negative.      Objective:    Physical Exam  Constitutional: He is oriented to person, place, and time. He appears well-developed and well-nourished. No distress.  Cardiovascular: Normal rate and regular rhythm.  Pulmonary/Chest: Effort  normal. No respiratory distress.  Neurological: He is alert and oriented to person, place, and time.  Skin: Skin is warm and dry. No rash noted. He is not diaphoretic. No erythema. No pallor.  Psychiatric: He has a normal mood and affect. His behavior is normal. Judgment and thought content normal.  Nursing note and vitals reviewed.   BP 126/77   Pulse (!) 55   Temp 98 F (36.7 C) (Oral)   Resp 16   Wt 129 lb (58.5 kg)   SpO2 100%   BMI 21.47 kg/m  Wt Readings from Last 3 Encounters:  12/03/18 129 lb (58.5 kg)  08/06/18 127 lb (57.6 kg)  07/26/18 127 lb 13.9 oz (58 kg)    Health Maintenance Due  Topic Date Due  . INFLUENZA VACCINE  10/16/2018    There are no preventive care reminders to display for this patient.   Lab Results  Component Value Date   TSH 1.150 10/21/2017   Lab Results  Component Value Date   WBC  6.6 07/23/2018   HGB 15.1 07/23/2018   HCT 43.8 07/23/2018   MCV 87 07/23/2018   PLT 170 07/23/2018   Lab Results  Component Value Date   NA 139 07/23/2018   K 4.3 07/23/2018   CO2 21 07/23/2018   GLUCOSE 79 07/23/2018   BUN 14 07/23/2018   CREATININE 0.86 07/23/2018   BILITOT 0.7 07/23/2018   ALKPHOS 76 07/23/2018   AST 21 07/23/2018   ALT 20 07/23/2018   PROT 7.3 07/23/2018   ALBUMIN 4.6 07/23/2018   CALCIUM 10.0 07/23/2018   Lab Results  Component Value Date   CHOL 137 07/23/2018   Lab Results  Component Value Date   HDL 51 07/23/2018   Lab Results  Component Value Date   LDLCALC 77 07/23/2018   Lab Results  Component Value Date   TRIG 43 07/23/2018   Lab Results  Component Value Date   CHOLHDL 2.7 07/23/2018   No results found for: HGBA1C     Assessment & Plan:   Problem List Items Addressed This Visit    None    Visit Diagnoses    Exposure to Covid-19 Virus    -  Primary   Relevant Orders   Novel Coronavirus, NAA ([facility])       No orders of the defined types were placed in this encounter.   PLAN  Potential exposure to COVID through his mother, who works in ED  Test sent. Discussed that results back in 3-5 days in most cases  Patient encouraged to call clinic with any questions, comments, or concerns.    Janeece Ageeichard Florentina Marquart, NP

## 2018-12-03 NOTE — Patient Instructions (Signed)
° ° ° °  If you have lab work done today you will be contacted with your lab results within the next 2 weeks.  If you have not heard from us then please contact us. The fastest way to get your results is to register for My Chart. ° ° °IF you received an x-ray today, you will receive an invoice from [facility]. Please contact [facility] at [phone] with questions or concerns regarding your invoice.  ° °IF you received labwork today, you will receive an invoice from [facility]. Please contact [facility] at [phone] with questions or concerns regarding your invoice.  ° °Our billing staff will not be able to assist you with questions regarding bills from these companies. ° °You will be contacted with the lab results as soon as they are available. The fastest way to get your results is to activate your My Chart account. Instructions are located on the last page of this paperwork. If you have not heard from us regarding the results in 2 weeks, please contact this office. °  ° ° ° °

## 2018-12-04 LAB — NOVEL CORONAVIRUS, NAA: SARS-CoV-2, NAA: NOT DETECTED

## 2018-12-06 ENCOUNTER — Encounter: Payer: Self-pay | Admitting: Registered Nurse

## 2018-12-27 ENCOUNTER — Other Ambulatory Visit: Payer: Self-pay

## 2018-12-27 ENCOUNTER — Encounter (HOSPITAL_BASED_OUTPATIENT_CLINIC_OR_DEPARTMENT_OTHER): Payer: Self-pay | Admitting: *Deleted

## 2018-12-28 ENCOUNTER — Other Ambulatory Visit: Payer: Self-pay | Admitting: Orthopedic Surgery

## 2018-12-29 ENCOUNTER — Other Ambulatory Visit: Payer: Self-pay | Admitting: Orthopedic Surgery

## 2018-12-30 ENCOUNTER — Other Ambulatory Visit (HOSPITAL_COMMUNITY)
Admission: RE | Admit: 2018-12-30 | Discharge: 2018-12-30 | Disposition: A | Discharge status: Home / Self Care | Payer: 59 | Source: Ambulatory Visit | Attending: Orthopedic Surgery | Admitting: Orthopedic Surgery

## 2018-12-30 DIAGNOSIS — Z20828 Contact with and (suspected) exposure to other viral communicable diseases: Secondary | ICD-10-CM | POA: Insufficient documentation

## 2018-12-30 NOTE — Progress Notes (Signed)

## 2019-01-01 LAB — NOVEL CORONAVIRUS, NAA (HOSP ORDER, SEND-OUT TO REF LAB; TAT 18-24 HRS): SARS-CoV-2, NAA: NOT DETECTED

## 2019-01-03 ENCOUNTER — Ambulatory Visit (HOSPITAL_BASED_OUTPATIENT_CLINIC_OR_DEPARTMENT_OTHER): Payer: 59 | Admitting: Anesthesiology

## 2019-01-03 ENCOUNTER — Encounter (HOSPITAL_BASED_OUTPATIENT_CLINIC_OR_DEPARTMENT_OTHER)
Admission: RE | Disposition: A | Discharge status: Home / Self Care | Payer: Self-pay | Source: Home / Self Care | Attending: Orthopedic Surgery

## 2019-01-03 ENCOUNTER — Encounter (HOSPITAL_BASED_OUTPATIENT_CLINIC_OR_DEPARTMENT_OTHER): Payer: Self-pay | Admitting: *Deleted

## 2019-01-03 ENCOUNTER — Ambulatory Visit (HOSPITAL_BASED_OUTPATIENT_CLINIC_OR_DEPARTMENT_OTHER)
Admission: RE | Admit: 2019-01-03 | Discharge: 2019-01-03 | Disposition: A | Discharge status: Home / Self Care | Payer: 59 | Attending: Orthopedic Surgery | Admitting: Orthopedic Surgery

## 2019-01-03 DIAGNOSIS — S43491A Other sprain of right shoulder joint, initial encounter: Secondary | ICD-10-CM | POA: Insufficient documentation

## 2019-01-03 DIAGNOSIS — S43431A Superior glenoid labrum lesion of right shoulder, initial encounter: Secondary | ICD-10-CM | POA: Diagnosis not present

## 2019-01-03 DIAGNOSIS — G8918 Other acute postprocedural pain: Secondary | ICD-10-CM | POA: Diagnosis not present

## 2019-01-03 DIAGNOSIS — X58XXXA Exposure to other specified factors, initial encounter: Secondary | ICD-10-CM | POA: Diagnosis not present

## 2019-01-03 HISTORY — PX: LABRAL REPAIR: SHX5172

## 2019-01-03 SURGERY — ARTHROSCOPIC LABRAL REPAIR
Anesthesia: General | Site: Shoulder | Laterality: Right

## 2019-01-03 MED ORDER — BUPIVACAINE LIPOSOME 1.3 % IJ SUSP
INTRAMUSCULAR | Status: DC | PRN
  Administered 2019-01-03: 10 mL via PERINEURAL

## 2019-01-03 MED ORDER — PROPOFOL 500 MG/50ML IV EMUL
INTRAVENOUS | Status: AC
  Filled 2019-01-03: qty 50

## 2019-01-03 MED ORDER — BUPIVACAINE-EPINEPHRINE (PF) 0.5% -1:200000 IJ SOLN
INTRAMUSCULAR | Status: DC | PRN
  Administered 2019-01-03: 20 mL via PERINEURAL

## 2019-01-03 MED ORDER — OXYCODONE-ACETAMINOPHEN 5-325 MG PO TABS: ORAL_TABLET | ORAL | 0 refills | Status: DC

## 2019-01-03 MED ORDER — LACTATED RINGERS IV SOLN: INTRAVENOUS | Status: DC

## 2019-01-03 MED ORDER — PROPOFOL 10 MG/ML IV BOLUS
INTRAVENOUS | Status: AC
  Filled 2019-01-03: qty 20

## 2019-01-03 MED ORDER — DEXAMETHASONE SODIUM PHOSPHATE 10 MG/ML IJ SOLN
INTRAMUSCULAR | Status: AC
  Filled 2019-01-03: qty 1

## 2019-01-03 MED ORDER — SODIUM CHLORIDE 0.9 % IR SOLN
Status: DC | PRN
  Administered 2019-01-03: 5000 mL

## 2019-01-03 MED ORDER — OXYCODONE HCL 5 MG/5ML PO SOLN: 5.0000 mg | Freq: Once | ORAL | Status: DC | PRN

## 2019-01-03 MED ORDER — TIZANIDINE HCL 4 MG PO TABS: 4.0000 mg | ORAL_TABLET | Freq: Three times a day (TID) | ORAL | 1 refills | Status: DC | PRN

## 2019-01-03 MED ORDER — SUGAMMADEX SODIUM 200 MG/2ML IV SOLN
INTRAVENOUS | Status: DC | PRN
  Administered 2019-01-03: 200 mg via INTRAVENOUS

## 2019-01-03 MED ORDER — ROCURONIUM BROMIDE 50 MG/5ML IV SOSY
PREFILLED_SYRINGE | INTRAVENOUS | Status: DC | PRN
  Administered 2019-01-03: 50 mg via INTRAVENOUS

## 2019-01-03 MED ORDER — DEXAMETHASONE SODIUM PHOSPHATE 10 MG/ML IJ SOLN
INTRAMUSCULAR | Status: DC | PRN
  Administered 2019-01-03: 10 mg via INTRAVENOUS

## 2019-01-03 MED ORDER — MIDAZOLAM HCL 2 MG/2ML IJ SOLN
INTRAMUSCULAR | Status: AC
  Filled 2019-01-03: qty 2

## 2019-01-03 MED ORDER — CHLORHEXIDINE GLUCONATE 4 % EX LIQD: 60.0000 mL | Freq: Once | CUTANEOUS | Status: DC

## 2019-01-03 MED ORDER — MEPERIDINE HCL 25 MG/ML IJ SOLN: 6.2500 mg | INTRAMUSCULAR | Status: DC | PRN

## 2019-01-03 MED ORDER — ACETAMINOPHEN 325 MG PO TABS: 325.0000 mg | ORAL_TABLET | ORAL | Status: DC | PRN

## 2019-01-03 MED ORDER — LIDOCAINE 2% (20 MG/ML) 5 ML SYRINGE
INTRAMUSCULAR | Status: DC | PRN
  Administered 2019-01-03: 100 mg via INTRAVENOUS

## 2019-01-03 MED ORDER — PROPOFOL 10 MG/ML IV BOLUS
INTRAVENOUS | Status: DC | PRN
  Administered 2019-01-03: 150 mg via INTRAVENOUS

## 2019-01-03 MED ORDER — FENTANYL CITRATE (PF) 100 MCG/2ML IJ SOLN
INTRAMUSCULAR | Status: AC
  Filled 2019-01-03: qty 2

## 2019-01-03 MED ORDER — ONDANSETRON HCL 4 MG/2ML IJ SOLN
INTRAMUSCULAR | Status: AC
  Filled 2019-01-03: qty 2

## 2019-01-03 MED ORDER — OXYCODONE HCL 5 MG PO TABS: 5.0000 mg | ORAL_TABLET | Freq: Once | ORAL | Status: DC | PRN

## 2019-01-03 MED ORDER — ONDANSETRON HCL 4 MG/2ML IJ SOLN: 4.0000 mg | Freq: Once | INTRAMUSCULAR | Status: DC | PRN

## 2019-01-03 MED ORDER — FENTANYL CITRATE (PF) 100 MCG/2ML IJ SOLN
50.0000 ug | INTRAMUSCULAR | Status: DC | PRN
  Administered 2019-01-03: 100 ug via INTRAVENOUS

## 2019-01-03 MED ORDER — ONDANSETRON HCL 4 MG/2ML IJ SOLN
INTRAMUSCULAR | Status: DC | PRN
  Administered 2019-01-03: 4 mg via INTRAVENOUS

## 2019-01-03 MED ORDER — CEFAZOLIN SODIUM-DEXTROSE 2-4 GM/100ML-% IV SOLN
INTRAVENOUS | Status: AC
  Filled 2019-01-03: qty 100

## 2019-01-03 MED ORDER — FENTANYL CITRATE (PF) 100 MCG/2ML IJ SOLN: 25.0000 ug | INTRAMUSCULAR | Status: DC | PRN

## 2019-01-03 MED ORDER — MIDAZOLAM HCL 2 MG/2ML IJ SOLN
1.0000 mg | INTRAMUSCULAR | Status: DC | PRN
  Administered 2019-01-03: 2 mg via INTRAVENOUS

## 2019-01-03 MED ORDER — CEFAZOLIN SODIUM-DEXTROSE 2-4 GM/100ML-% IV SOLN
2.0000 g | INTRAVENOUS | Status: AC
  Administered 2019-01-03: 2 g via INTRAVENOUS

## 2019-01-03 MED ORDER — ACETAMINOPHEN 160 MG/5ML PO SOLN: 325.0000 mg | ORAL | Status: DC | PRN

## 2019-01-03 SURGICAL SUPPLY — 59 items
ANCHOR SUT BIOCOMP LK 2.9X12.5 (Anchor) ×4 IMPLANT
BLADE SHAVER BONE 5.0X13 (MISCELLANEOUS) IMPLANT
BURR OVAL 8 FLU 4.0X13 (MISCELLANEOUS) IMPLANT
CANNULA 5.75X71 LONG (CANNULA) ×2 IMPLANT
CANNULA TWIST IN 8.25X7CM (CANNULA) ×2 IMPLANT
CHLORAPREP W/TINT 26 (MISCELLANEOUS) ×2 IMPLANT
COVER WAND RF STERILE (DRAPES) IMPLANT
DRAPE HALF SHEET 70X43 (DRAPES) IMPLANT
DRAPE IMP U-DRAPE 54X76 (DRAPES) ×2 IMPLANT
DRAPE INCISE IOBAN 66X45 STRL (DRAPES) ×2 IMPLANT
DRAPE STERI 35X30 U-POUCH (DRAPES) ×2 IMPLANT
DRAPE SURG 17X23 STRL (DRAPES) ×2 IMPLANT
DRAPE U-SHAPE 47X51 STRL (DRAPES) ×2 IMPLANT
DRAPE U-SHAPE 76X120 STRL (DRAPES) ×4 IMPLANT
DRSG PAD ABDOMINAL 8X10 ST (GAUZE/BANDAGES/DRESSINGS) IMPLANT
ELECT REM PT RETURN 9FT ADLT (ELECTROSURGICAL)
ELECTRODE REM PT RTRN 9FT ADLT (ELECTROSURGICAL) IMPLANT
FIBERSTICK 2 (SUTURE) IMPLANT
GAUZE 4X4 16PLY RFD (DISPOSABLE) IMPLANT
GAUZE SPONGE 4X4 12PLY STRL (GAUZE/BANDAGES/DRESSINGS) ×2 IMPLANT
GAUZE XEROFORM 1X8 LF (GAUZE/BANDAGES/DRESSINGS) ×2 IMPLANT
GLOVE BIO SURGEON STRL SZ7 (GLOVE) ×2 IMPLANT
GLOVE BIO SURGEON STRL SZ7.5 (GLOVE) ×2 IMPLANT
GLOVE BIOGEL PI IND STRL 7.0 (GLOVE) ×1 IMPLANT
GLOVE BIOGEL PI IND STRL 8 (GLOVE) ×1 IMPLANT
GLOVE BIOGEL PI INDICATOR 7.0 (GLOVE) ×1
GLOVE BIOGEL PI INDICATOR 8 (GLOVE) ×1
GOWN STRL REUS W/ TWL LRG LVL3 (GOWN DISPOSABLE) ×2 IMPLANT
GOWN STRL REUS W/ TWL XL LVL3 (GOWN DISPOSABLE) ×1 IMPLANT
GOWN STRL REUS W/TWL LRG LVL3 (GOWN DISPOSABLE) ×2
GOWN STRL REUS W/TWL XL LVL3 (GOWN DISPOSABLE) ×1
IV NS IRRIG 3000ML ARTHROMATIC (IV SOLUTION) ×4 IMPLANT
KIT PERC INSERT 3.0 KNTLS (KITS) ×2 IMPLANT
KIT PUSHLOCK 2.9 HIP (KITS) ×2 IMPLANT
LASSO 90 CVE QUICKPAS (DISPOSABLE) ×2 IMPLANT
LASSO CRESCENT QUICKPASS (SUTURE) IMPLANT
MANIFOLD NEPTUNE II (INSTRUMENTS) ×2 IMPLANT
NEEDLE SPNL 18GX3.5 QUINCKE PK (NEEDLE) IMPLANT
NS IRRIG 1000ML POUR BTL (IV SOLUTION) IMPLANT
PACK ARTHROSCOPY DSU (CUSTOM PROCEDURE TRAY) ×2 IMPLANT
PACK BASIN DAY SURGERY FS (CUSTOM PROCEDURE TRAY) ×2 IMPLANT
PORT APPOLLO RF 90DEGREE MULTI (SURGICAL WAND) IMPLANT
PROBE BIPOLAR ATHRO 135MM 90D (MISCELLANEOUS) ×2 IMPLANT
SLEEVE SCD COMPRESS KNEE MED (MISCELLANEOUS) ×2 IMPLANT
SLING ARM FOAM STRAP LRG (SOFTGOODS) ×2 IMPLANT
SUPPORT WRAP ARM LG (MISCELLANEOUS) IMPLANT
SUT ETHILON 3 0 PS 1 (SUTURE) ×2 IMPLANT
SUT ETHILON 4 0 PS 2 18 (SUTURE) IMPLANT
SUT MNCRL AB 3-0 PS2 18 (SUTURE) IMPLANT
SUT PDS AB 1 CT  36 (SUTURE) ×1
SUT PDS AB 1 CT 36 (SUTURE) ×1 IMPLANT
SUTURE TAPE 1.3 40 TPR END (SUTURE) ×1 IMPLANT
SUTURETAPE 1.3 40 TPR END (SUTURE) ×2
TAPE LABRALWHITE 1.5X36 (TAPE) ×2 IMPLANT
TAPE SUT LABRALTAP WHT/BLK (SUTURE) IMPLANT
TOWEL GREEN STERILE FF (TOWEL DISPOSABLE) ×2 IMPLANT
TUBE CONNECTING 20X1/4 (TUBING) ×2 IMPLANT
TUBING ARTHROSCOPY IRRIG 16FT (MISCELLANEOUS) ×2 IMPLANT
WATER STERILE IRR 1000ML POUR (IV SOLUTION) ×2 IMPLANT

## 2019-01-03 NOTE — H&P (Signed)
Adam Gray Adam Gray is an 23 y.o. male.   Chief Complaint: R shoulder pain and instability HPI: R shoulder instability and pain with labral tear.  Failed conservative treatment.  Past Medical History:  Diagnosis Date  . Acne   . History of shingles     Past Surgical History:  Procedure Laterality Date  . NO PAST SURGERIES      Family History  Problem Relation Age of Onset  . Hypertension Mother   . Asthma Mother   . Hypertension Father   . Diabetes Father   . Cancer Maternal Grandmother   . Uterine cancer Maternal Grandmother    Social History:  reports that he has never smoked. He has never used smokeless tobacco. He reports that he does not drink alcohol or use drugs.  Allergies: No Known Allergies  No medications prior to admission.    No results found for this or any previous visit (from the past 48 hour(s)). No results found.  ROS  Blood pressure 117/64, pulse (!) 59, temperature (!) 97.5 F (36.4 C), temperature source Oral, resp. rate 16, height 5\' 6"  (1.676 m), weight 58.1 kg, SpO2 100 %. Physical Exam   Assessment/Plan R shoulder instability and pain with labral tear.  Failed conservative treatment. Plan R arthr labral repair. Risks / benefits of surgery discussed Consent on chart  NPO for OR Preop antibiotics   Isabella Stalling, MD 01/03/2019, 12:19 PM

## 2019-01-03 NOTE — Op Note (Signed)
Procedure(s): ARTHROSCOPIC LABRAL REPAIR Procedure Note  Adam Gray male 23 y.o. 01/03/2019   Preoperative diagnosis: #1 right shoulder type II SLAP tear #2 right shoulder anterior inferior labral tear  Postoperative diagnosis: Same  Procedure(s) and Anesthesia Type: #1 right shoulder arthroscopic SLAP repair #2 right shoulder arthroscopic anterior inferior labral repair  Surgeon(s) and Role:    Tania Ade, MD - Primary     Surgeon: Isabella Stalling   Assistants: Jeanmarie Hubert PA-C [facility] was present and scrubbed throughout the procedure and was essential in positioning, assisting with the camera and instrumentation,, and closure)  Anesthesia: General endotracheal anesthesia with preoperative interscalene block given by the attending anesthesiologist     Procedure Detail  ARTHROSCOPIC LABRAL REPAIR  Estimated Blood Loss: Min         Drains: none  Blood Given: none         Specimens: none        Complications:  * No complications entered in OR log *         Disposition: PACU - hemodynamically stable.         Condition: stable    Procedure:   INDICATIONS FOR SURGERY: The patient is 23 y.o. male who has had a history of right shoulder pain and instability which has been refractory to nonoperative management.  MRI showed type II SLAP tear as well as anterior-inferior labral tearing.  Indicated for surgical treatment to decrease pain and instability.  OPERATIVE FINDINGS: Examination under anesthesia: He has a fairly loose shoulder with 2+ anterior 1+ posterior laxity.   DESCRIPTION OF PROCEDURE: The patient was identified in preoperative  holding area where I personally marked the operative site after  verifying site, side, and procedure with the patient. An interscalene block was given by the attending anesthesiologist the holding area.  The patient was taken back to the operating room where general anesthesia was induced without  complication and was placed in the beach-chair position with the back  elevated about 60 degrees and all extremities and head and neck carefully padded and  positioned.   The right upper extremity was then prepped and  draped in a standard sterile fashion. The appropriate time-out  procedure was carried out. The patient did receive IV antibiotics  within 30 minutes of incision.   A small posterior portal incision was made and the arthroscope was introduced into the joint. An anterior portal was then established above the subscapularis using needle localization. Small cannula was placed anteriorly. Diagnostic arthroscopy was then carried out  The subscapularis was noted to be completely intact.  The anterior inferior labrum had separation around the 3 to 4 o'clock position but there did not appear to be medial displacement of the inferior labrum consistent with typical Bankart tearing.  There was looseness of the anterior ligamentous and capsular structures.  The superior labrum was completely detached involving the entire origin of the biceps anchor consistent with a type II SLAP tear.  The subscapularis supraspinatus and infraspinatus were completely intact.  Attention was first turned to the SLAP tear where the superior glenoid was debrided down to a bleeding bony surface to promote healing and then the repair was carried out by first placing a labral tape just posterior to the biceps around the labrum.  This was placed into a 2.9 push lock anchor in the superior glenoid tubercle through a percutaneously placed cannula at the musculotendinous junction of the supraspinatus.  This brought the labrum down nicely and very  securely.  After placing this this actually completely repaired the tear in the superior labrum and an additional anchor was not felt to be necessary.  Attention was then turned back to the anterior labrum the camera was moved to the percutaneous portal position to get a better  look at the anterior labrum.  Again it was felt that this tear did not represent a typical Bankart tear and there was not detachment all the way down to the bottom.  However the inferior glenohumeral ligament and capsule was quite loose and given his history of instability and laxity on exam I did feel that tightening this would be beneficial for him.  Therefore 1 labral tape was placed with a passed through the capsule and inferior glenohumeral ligament and then passed through the area of the tear at around the 4 o'clock position.  This was placed in an anchor at the location of the tear bringing the labrum back nicely and tightening the anterior shoulder slightly.  1 anchor was felt to secure this area.  The arthroscopic equipment was removed from the joint and the portals were closed with 3-0 nylon in an interrupted fashion. Sterile dressings were then applied including Xeroform 4 x 4's ABDs and tape. The patient was then allowed to awaken from general anesthesia, placed in a sling, transferred to the stretcher and taken to the recovery room in stable condition.   POSTOPERATIVE PLAN: The patient will be discharged home today and will followup in one week for suture removal and wound check.  He will follow the standard labral protocol.

## 2019-01-03 NOTE — Anesthesia Postprocedure Evaluation (Signed)
Anesthesia Post Note  Patient: Adam Gray  Procedure(s) Performed: ARTHROSCOPIC LABRAL REPAIR (Right Shoulder)     Patient location during evaluation: PACU Anesthesia Type: General Level of consciousness: awake and alert Pain management: pain level controlled Vital Signs Assessment: post-procedure vital signs reviewed and stable Respiratory status: spontaneous breathing, nonlabored ventilation, respiratory function stable and patient connected to nasal cannula oxygen Cardiovascular status: blood pressure returned to baseline and stable Postop Assessment: no apparent nausea or vomiting Anesthetic complications: no    Last Vitals:  Vitals:   01/03/19 1400 01/03/19 1428  BP: 98/63 105/66  Pulse: 60 67  Resp: (!) 28 18  Temp:  36.6 C  SpO2: 99% 100%    Last Pain:  Vitals:   01/03/19 1428  TempSrc:   PainSc: 0-No pain                 Noah Lembke

## 2019-01-03 NOTE — Anesthesia Procedure Notes (Signed)
Anesthesia Regional Block: Interscalene brachial plexus block   Pre-Anesthetic Checklist: ,, timeout performed, Correct Patient, Correct Site, Correct Laterality, Correct Procedure, Correct Position, site marked, Risks and benefits discussed,  Surgical consent,  Pre-op evaluation,  At surgeon's request and post-op pain management  Laterality: Right  Prep: chloraprep       Needles:  Injection technique: Single-shot  Needle Type: Echogenic Stimulator Needle     Needle Length: 5cm  Needle Gauge: 22     Additional Needles:   Procedures:, nerve stimulator,,, ultrasound used (permanent image in chart),,,,   Nerve Stimulator or Paresthesia:  Response: hand, 0.45 mA,   Additional Responses:   Narrative:  Start time: 01/03/2019 11:38 AM End time: 01/03/2019 11:44 AM Injection made incrementally with aspirations every 5 mL.  Performed by: Personally  Anesthesiologist: Janeece Riggers, MD  Additional Notes: Functioning IV was confirmed and monitors were applied.  A 58mm 22ga Arrow echogenic stimulator needle was used. Sterile prep and drape,hand hygiene and sterile gloves were used. Ultrasound guidance: relevant anatomy identified, needle position confirmed, local anesthetic spread visualized around nerve(s)., vascular puncture avoided.  Image printed for medical record. Negative aspiration and negative test dose prior to incremental administration of local anesthetic. The patient tolerated the procedure well.

## 2019-01-03 NOTE — Anesthesia Procedure Notes (Signed)
Procedure Name: Intubation Date/Time: 01/03/2019 12:35 PM Performed by: Suan Halter, CRNA Pre-anesthesia Checklist: Patient identified, Emergency Drugs available, Suction available and Patient being monitored Patient Re-evaluated:Patient Re-evaluated prior to induction Oxygen Delivery Method: Circle system utilized Preoxygenation: Pre-oxygenation with 100% oxygen Induction Type: IV induction Ventilation: Mask ventilation without difficulty Laryngoscope Size: Mac and 4 Grade View: Grade I Tube type: Oral Tube size: 7.0 mm Number of attempts: 1 Airway Equipment and Method: Stylet and Oral airway Placement Confirmation: ETT inserted through vocal cords under direct vision,  positive ETCO2 and breath sounds checked- equal and bilateral Secured at: 21 cm Tube secured with: Tape Dental Injury: Teeth and Oropharynx as per pre-operative assessment

## 2019-01-03 NOTE — Transfer of Care (Signed)
Immediate Anesthesia Transfer of Care Note  Patient: Adam Gray  Procedure(s) Performed: ARTHROSCOPIC LABRAL REPAIR (Right Shoulder)  Patient Location: PACU  Anesthesia Type:General and GA combined with regional for post-op pain  Level of Consciousness: awake, alert  and oriented  Airway & Oxygen Therapy: Patient Spontanous Breathing and Patient connected to face mask oxygen  Post-op Assessment: Report given to RN and Post -op Vital signs reviewed and stable  Post vital signs: Reviewed and stable  Last Vitals:  Vitals Value Taken Time  BP    Temp    Pulse 72 01/03/19 1339  Resp 22 01/03/19 1339  SpO2 100 % 01/03/19 1339  Vitals shown include unvalidated device data.  Last Pain:  Vitals:   01/03/19 0958  TempSrc: Oral  PainSc: 0-No pain         Complications: No apparent anesthesia complications

## 2019-01-03 NOTE — Anesthesia Preprocedure Evaluation (Signed)
Anesthesia Evaluation  Patient identified by MRN, date of birth, ID band Patient awake    Reviewed: Allergy & Precautions, H&P , NPO status , Patient's Chart, lab work & pertinent test results, reviewed documented beta blocker date and time   Airway Mallampati: II  TM Distance: >3 FB Neck ROM: full    Dental no notable dental hx.    Pulmonary neg pulmonary ROS,    Pulmonary exam normal breath sounds clear to auscultation       Cardiovascular Exercise Tolerance: Good negative cardio ROS   Rhythm:regular Rate:Normal     Neuro/Psych negative neurological ROS  negative psych ROS   GI/Hepatic negative GI ROS, Neg liver ROS,   Endo/Other  negative endocrine ROS  Renal/GU negative Renal ROS  negative genitourinary   Musculoskeletal   Abdominal   Peds  Hematology negative hematology ROS (+)   Anesthesia Other Findings   Reproductive/Obstetrics negative OB ROS                             Anesthesia Physical Anesthesia Plan  ASA: II  Anesthesia Plan: General   Post-op Pain Management: GA combined w/ Regional for post-op pain   Induction:   PONV Risk Score and Plan:   Airway Management Planned: Oral ETT and LMA  Additional Equipment:   Intra-op Plan:   Post-operative Plan: Extubation in OR  Informed Consent: I have reviewed the patients History and Physical, chart, labs and discussed the procedure including the risks, benefits and alternatives for the proposed anesthesia with the patient or authorized representative who has indicated his/her understanding and acceptance.     Dental Advisory Given  Plan Discussed with: CRNA, Anesthesiologist and Surgeon  Anesthesia Plan Comments: (Discussed both nerve block for pain relief post-op and GA; including NV, sore throat, dental injury, and pulmonary complications)        Anesthesia Quick Evaluation

## 2019-01-03 NOTE — Progress Notes (Signed)
Assisted Dr. Oddono with right, ultrasound guided, interscalene  block. Side rails up, monitors on throughout procedure. See vital signs in flow sheet. Tolerated Procedure well. 

## 2019-01-03 NOTE — Discharge Instructions (Signed)
Discharge Instructions after Arthroscopic Shoulder Repair   A sling has been provided for you. Remain in your sling at all times. This includes sleeping in your sling.  Use ice on the shoulder intermittently over the first 48 hours after surgery.  Pain medicine has been prescribed for you.  Use your medicine liberally over the first 48 hours, and then you can begin to taper your use. You may take Extra Strength Tylenol or Tylenol only in place of the pain pills. DO NOT take ANY nonsteroidal anti-inflammatory pain medications: Advil, Motrin, Ibuprofen, Aleve, Naproxen, or Narprosyn.  You may remove your dressing after two days. If the incision sites are still moist, place a Band-Aid over the moist site(s). Change Band-Aids daily until dry.  You may shower 5 days after surgery. The incisions CANNOT get wet prior to 5 days. Simply allow the water to wash over the site and then pat dry. Do not rub the incisions. Make sure your axilla (armpit) is completely dry after showering.  Take one aspirin a day for 2 weeks after surgery, unless you have an aspirin sensitivity/ allergy or asthma.   Please call [phone] during normal business hours or [phone] after hours for any problems. Including the following:  - excessive redness of the incisions - drainage for more than 4 days - fever of more than 101.5 F  *Please note that pain medications will not be refilled after hours or on weekends.    Post Anesthesia Home Care Instructions  Activity: Get plenty of rest for the remainder of the day. A responsible individual must stay with you for 24 hours following the procedure.  For the next 24 hours, DO NOT: -Drive a car -Operate machinery -Drink alcoholic beverages -Take any medication unless instructed by your physician -Make any legal decisions or sign important papers.  Meals: Start with liquid foods such as gelatin or soup. Progress to regular foods as tolerated. Avoid greasy, spicy, heavy  foods. If nausea and/or vomiting occur, drink only clear liquids until the nausea and/or vomiting subsides. Call your physician if vomiting continues.  Special Instructions/Symptoms: Your throat may feel dry or sore from the anesthesia or the breathing tube placed in your throat during surgery. If this causes discomfort, gargle with warm salt water. The discomfort should disappear within 24 hours.  If you had a scopolamine patch placed behind your ear for the management of post- operative nausea and/or vomiting:  1. The medication in the patch is effective for 72 hours, after which it should be removed.  Wrap patch in a tissue and discard in the trash. Wash hands thoroughly with soap and water. 2. You may remove the patch earlier than 72 hours if you experience unpleasant side effects which may include dry mouth, dizziness or visual disturbances. 3. Avoid touching the patch. Wash your hands with soap and water after contact with the patch.    Information for Discharge Teaching: EXPAREL (bupivacaine liposome injectable suspension)   Your surgeon or anesthesiologist gave you EXPAREL(bupivacaine) to help control your pain after surgery.  EXPAREL is a local anesthetic that provides pain relief by numbing the tissue around the surgical site. EXPAREL is designed to release pain medication over time and can control pain for up to 72 hours. Depending on how you respond to EXPAREL, you may require less pain medication during your recovery.  Possible side effects: Temporary loss of sensation or ability to move in the area where bupivacaine was injected. Nausea, vomiting, constipation Rarely, numbness and tingling in   Rarely, numbness and tingling in your mouth or lips, lightheadedness, or anxiety may occur.  Call your doctor right away if you think you may be experiencing any of these sensations, or if you have other questions regarding possible side effects.  Follow all other discharge instructions given to you by  your surgeon or nurse. Eat a healthy diet and drink plenty of water or other fluids.  If you return to the hospital for any reason within 96 hours following the administration of EXPAREL, it is important for health care providers to know that you have received this anesthetic. A teal colored band has been placed on your arm with the date, time and amount of EXPAREL you have received in order to alert and inform your health care providers. Please leave this armband in place for the full 96 hours following administration, and then you may remove the band.

## 2019-01-04 ENCOUNTER — Encounter (HOSPITAL_BASED_OUTPATIENT_CLINIC_OR_DEPARTMENT_OTHER): Payer: Self-pay | Admitting: Orthopedic Surgery

## 2019-01-18 MED FILL — NYSTATIN 100,000 UNIT/GM PO: 100000 | 30 days supply | Qty: 60 | Fill #0

## 2019-01-28 DIAGNOSIS — Z760 Encounter for issue of repeat prescription: Secondary | ICD-10-CM | POA: Diagnosis not present

## 2019-03-21 DIAGNOSIS — S43431D Superior glenoid labrum lesion of right shoulder, subsequent encounter: Secondary | ICD-10-CM | POA: Diagnosis not present

## 2019-03-24 DIAGNOSIS — S43431D Superior glenoid labrum lesion of right shoulder, subsequent encounter: Secondary | ICD-10-CM | POA: Diagnosis not present

## 2019-03-29 DIAGNOSIS — S43431D Superior glenoid labrum lesion of right shoulder, subsequent encounter: Secondary | ICD-10-CM | POA: Diagnosis not present

## 2019-03-31 DIAGNOSIS — S43431D Superior glenoid labrum lesion of right shoulder, subsequent encounter: Secondary | ICD-10-CM | POA: Diagnosis not present

## 2019-04-05 DIAGNOSIS — S43431D Superior glenoid labrum lesion of right shoulder, subsequent encounter: Secondary | ICD-10-CM | POA: Diagnosis not present

## 2019-04-07 DIAGNOSIS — S43431D Superior glenoid labrum lesion of right shoulder, subsequent encounter: Secondary | ICD-10-CM | POA: Diagnosis not present

## 2019-04-11 DIAGNOSIS — S43431D Superior glenoid labrum lesion of right shoulder, subsequent encounter: Secondary | ICD-10-CM | POA: Diagnosis not present

## 2019-04-13 DIAGNOSIS — S43431D Superior glenoid labrum lesion of right shoulder, subsequent encounter: Secondary | ICD-10-CM | POA: Diagnosis not present

## 2019-04-19 DIAGNOSIS — S43431D Superior glenoid labrum lesion of right shoulder, subsequent encounter: Secondary | ICD-10-CM | POA: Diagnosis not present

## 2019-04-21 DIAGNOSIS — S43431D Superior glenoid labrum lesion of right shoulder, subsequent encounter: Secondary | ICD-10-CM | POA: Diagnosis not present

## 2019-04-26 DIAGNOSIS — S43431D Superior glenoid labrum lesion of right shoulder, subsequent encounter: Secondary | ICD-10-CM | POA: Diagnosis not present

## 2019-04-28 DIAGNOSIS — S43431D Superior glenoid labrum lesion of right shoulder, subsequent encounter: Secondary | ICD-10-CM | POA: Diagnosis not present

## 2019-05-03 DIAGNOSIS — S43431D Superior glenoid labrum lesion of right shoulder, subsequent encounter: Secondary | ICD-10-CM | POA: Diagnosis not present

## 2019-05-10 DIAGNOSIS — S43431D Superior glenoid labrum lesion of right shoulder, subsequent encounter: Secondary | ICD-10-CM | POA: Diagnosis not present

## 2019-05-12 DIAGNOSIS — S43431D Superior glenoid labrum lesion of right shoulder, subsequent encounter: Secondary | ICD-10-CM | POA: Diagnosis not present

## 2019-05-13 DIAGNOSIS — Z09 Encounter for follow-up examination after completed treatment for conditions other than malignant neoplasm: Secondary | ICD-10-CM | POA: Diagnosis not present

## 2019-05-13 DIAGNOSIS — S43431D Superior glenoid labrum lesion of right shoulder, subsequent encounter: Secondary | ICD-10-CM | POA: Diagnosis not present

## 2019-07-28 ENCOUNTER — Ambulatory Visit (INDEPENDENT_AMBULATORY_CARE_PROVIDER_SITE_OTHER): Payer: 59 | Admitting: Family Medicine

## 2019-07-28 ENCOUNTER — Other Ambulatory Visit: Payer: Self-pay

## 2019-07-28 ENCOUNTER — Encounter: Payer: Self-pay | Admitting: Family Medicine

## 2019-07-28 VITALS — BP 128/72 | HR 60 | Temp 98.8°F | Resp 14 | Ht 65.5 in | Wt 131.0 lb

## 2019-07-28 DIAGNOSIS — Z0000 Encounter for general adult medical examination without abnormal findings: Secondary | ICD-10-CM | POA: Diagnosis not present

## 2019-07-28 NOTE — Patient Instructions (Addendum)
  Physically you appear very healthy.  I encourage everyone to seek to be physically, emotionally, relationally, and spiritually healthy.  Return as needed and best of luck in the [employer].   If you have lab work done today you will be contacted with your lab results within the next 2 weeks.  If you have not heard from [place] then please contact us. The fastest way to get your results is to register for My Chart.   IF you received an x-ray today, you will receive an invoice from [facility]. Please contact [facility] at [phone] with questions or concerns regarding your invoice.   IF you received labwork today, you will receive an invoice from [place]. Please contact [facility] at [phone] with questions or concerns regarding your invoice.   Our billing staff will not be able to assist you with questions regarding bills from these companies.  You will be contacted with the lab results as soon as they are available. The fastest way to get your results is to activate your My Chart account. Instructions are located on the last page of this paperwork. If you have not heard from [place] regarding the results in 2 weeks, please contact this office.

## 2019-07-28 NOTE — Progress Notes (Signed)
Patient ID: Adam Gray, male    DOB: 03-27-1995  Age: 24 y.o. MRN: [id number]  Chief Complaint  Patient presents with  . Annual Exam    pt is here to complete physical form for work, no concerns    Subjective:  Healthy-appearing young man who is here for a physical exam for going into the [employer] next month.  He is finishing at [employer] and graduating next week.  His only health problem is been a problem with the labrum was of his right shoulder which had to have operated on last fall.  After physical therapy it is fine.  He exercises regularly.  No major illnesses  Review of systems: Unremarkable.  No complaints with neurologic, psychiatric, cardiovascular, respiratory, GI, GU, musculoskeletal, dermatologic, endocrinologic.  Current allergies, medications, problem list, past/family and social histories reviewed.  He has received both of his Covid vaccinations.  Objective:  BP 128/72   Pulse 60   Temp 98.8 F (37.1 C) (Temporal)   Resp 14   Ht 5' 5.5" (1.664 m)   Wt 131 lb (59.4 kg)   SpO2 100%   BMI 21.47 kg/m   Healthy-appearing young man.  Lean.  TMs normal.  Eyes PERRL.  Fundi benign.  Throat clear.  Teeth good.  Neck supple without nodes or thyromegaly.  Chest clear to auscultation.  Heart rate without murmurs, gallops, or arrhythmias.  Abdomen soft without mass or tenderness.  Normal male external genitalia with testes centered.  No hernias.  Skin normal.  Has a couple of nevi on his flank/buttock area.  Range of motion of his shoulder that he had surgery on is good with good strength.  Assessment & Plan:   Assessment: No diagnosis found.    Plan: Per instructions  No orders of the defined types were placed in this encounter.   No orders of the defined types were placed in this encounter.        Patient Instructions    Physically you appear very healthy.  I encourage everyone to seek to be physically, emotionally,  relationally, and spiritually healthy.  Return as needed and best of luck in the [employer].   If you have lab work done today you will be contacted with your lab results within the next 2 weeks.  If you have not heard from [place] then please contact us. The fastest way to get your results is to register for My Chart.   IF you received an x-ray today, you will receive an invoice from [facility]. Please contact [facility] at [phone] with questions or concerns regarding your invoice.   IF you received labwork today, you will receive an invoice from [place]. Please contact [facility] at [phone] with questions or concerns regarding your invoice.   Our billing staff will not be able to assist you with questions regarding bills from these companies.  You will be contacted with the lab results as soon as they are available. The fastest way to get your results is to activate your My Chart account. Instructions are located on the last page of this paperwork. If you have not heard from [place] regarding the results in 2 weeks, please contact this office.        Return if symptoms worsen or fail to improve.   Janace Hoard, MD 07/28/2019

## 2020-06-11 ENCOUNTER — Telehealth (HOSPITAL_BASED_OUTPATIENT_CLINIC_OR_DEPARTMENT_OTHER): Payer: Self-pay | Admitting: General Practice

## 2020-06-11 NOTE — Telephone Encounter (Signed)
LVMTCB regarding the NP appointment that pt scheduled threw MyChart. Need Insurance information and let pt know this is an establishing care appointment.

## 2020-06-11 NOTE — Telephone Encounter (Signed)
Patient called back and got all information needed and made pt aware of the type of appt it will be.

## 2020-06-15 ENCOUNTER — Ambulatory Visit (HOSPITAL_BASED_OUTPATIENT_CLINIC_OR_DEPARTMENT_OTHER): Payer: Self-pay | Admitting: Nurse Practitioner

## 2020-09-06 IMAGING — MR MRI OF THE RIGHT SHOULDER WITH CONTRAST
7 series · 39 of 40 positions shown · IV contrast (agent unspecified)
Comparison: Plain films of the right shoulder 05/10/2014.

CLINICAL DATA: History of a right shoulder dislocation in 6102.
Continued right shoulder pain, particularly when the patient raises
arm.

EXAM:
MR ARTHROGRAM OF THE RIGHT SHOULDER
TECHNIQUE: Multiplanar, multisequence MR imaging of the right shoulder was
performed following the administration of intra-articular contrast.
CONTRAST:  See Injection Documentation.

[Series 5: PD fat-sat · axial · right · 4.0mm · 0.36mm/px · z∈[-126,-12]mm · 7 of 25 slices shown]
[im 1/25]
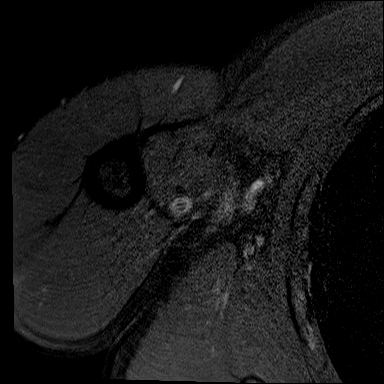
[im 5/25]
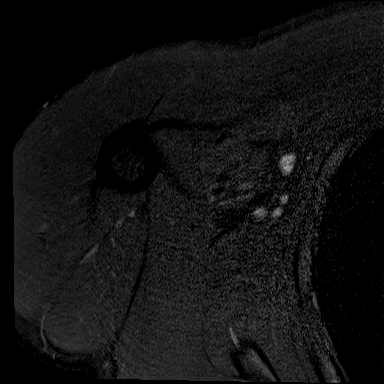
[im 9/25]
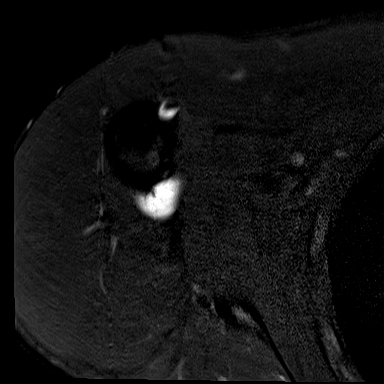
[im 13/25]
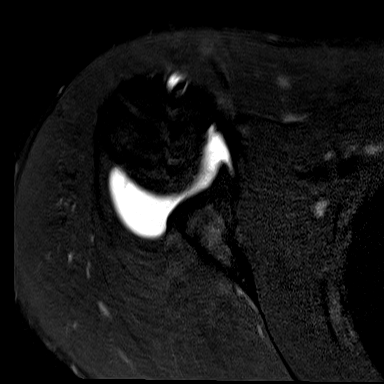
[im 17/25]
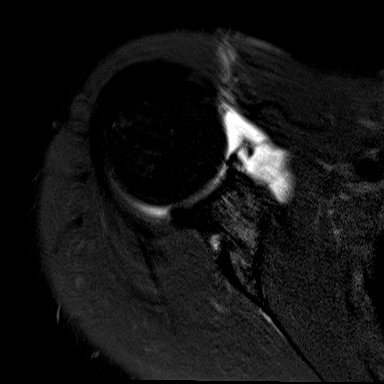
[im 21/25]
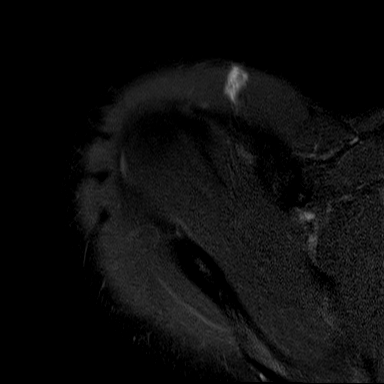
[im 25/25]
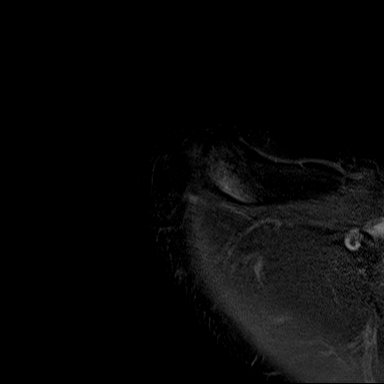

[Series 6: T1 fat-sat · axial · right · 4.0mm · 0.47mm/px · z∈[-118,-3]mm · 6 of 25 slices shown (1 of 3)]
[im 1/25]
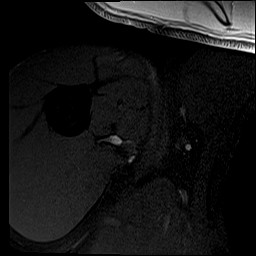
[im 5/25]
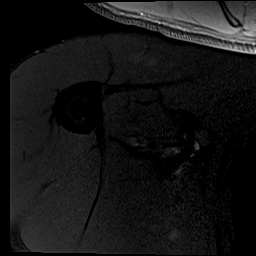
[im 10/25]
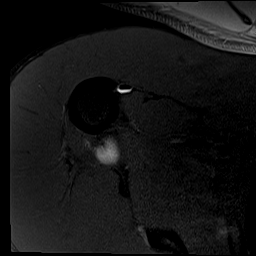
[im 15/25]
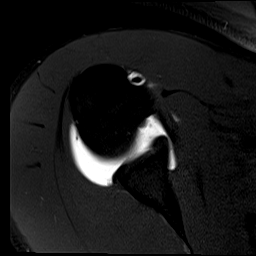
[im 20/25]
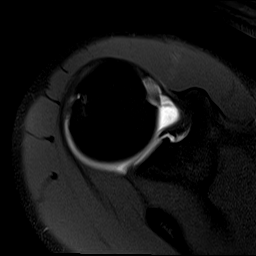
[im 25/25]
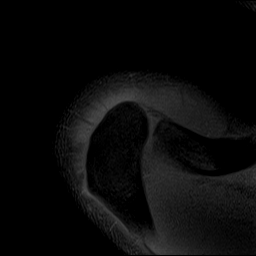

[Series 8: T1 · oblique · right · 4.0mm · 0.36mm/px · 5 of 23 slices shown]
[im 1/23]
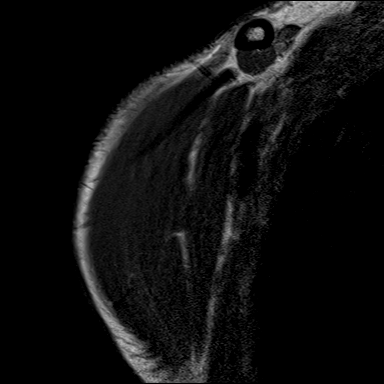
[im 6/23]
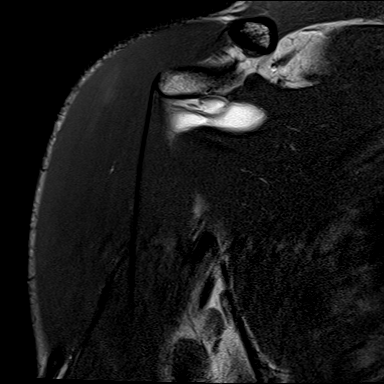
[im 12/23]
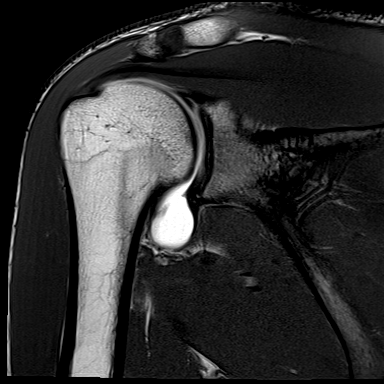
[im 17/23]
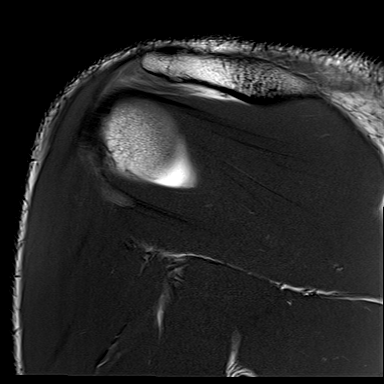
[im 23/23]
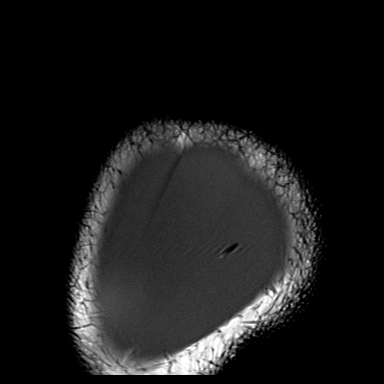

[Series 9: T1 fat-sat · oblique · right · 4.0mm · 0.36mm/px · 5 of 23 slices shown (2 of 3)]
[im 1/23]
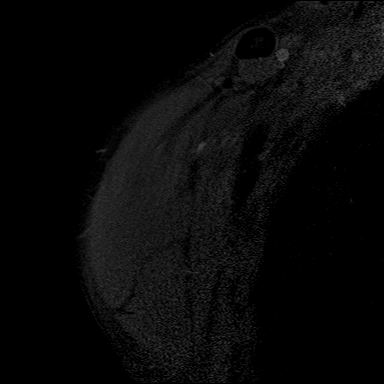
[im 6/23]
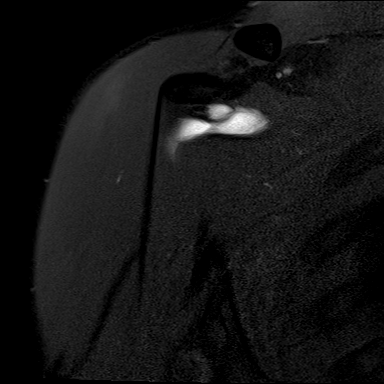
[im 12/23]
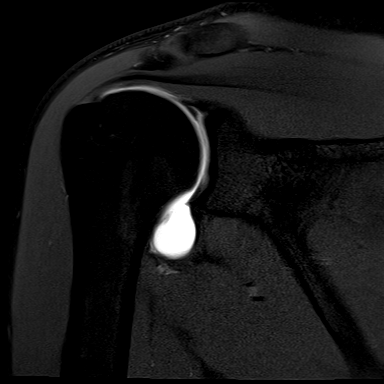
[im 17/23]
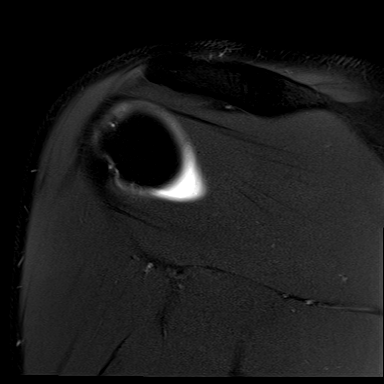
[im 23/23]
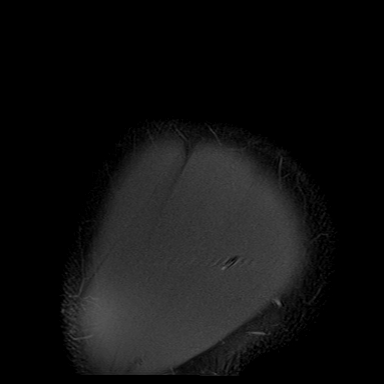

[Series 10: T2 fat-sat · oblique · right · 4.0mm · 0.44mm/px · 5 of 23 slices shown (1 of 2)]
[im 1/23]
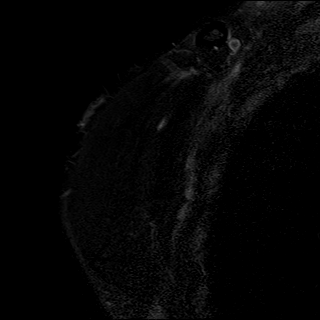
[im 6/23]
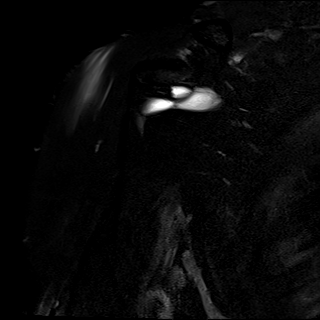
[im 12/23]
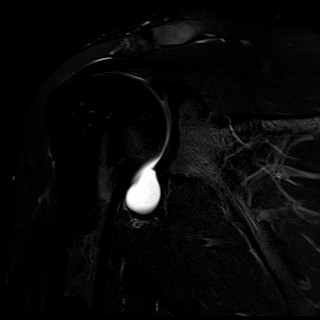
[im 17/23]
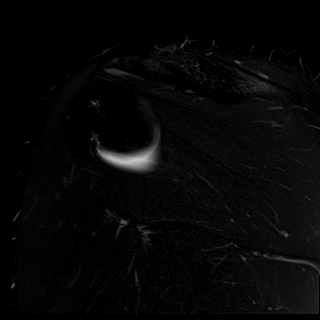
[im 23/23]
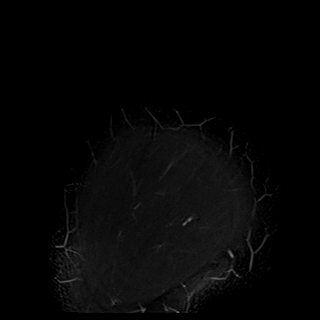

[Series 12: T2 fat-sat · oblique · right · 4.0mm · 0.44mm/px · 6 of 26 slices shown (2 of 2)]
[im 1/26]
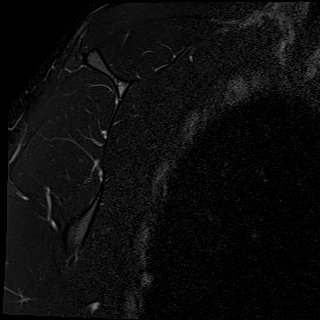
[im 6/26]
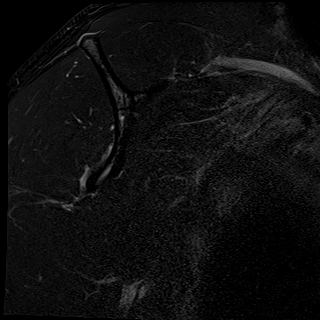
[im 11/26]
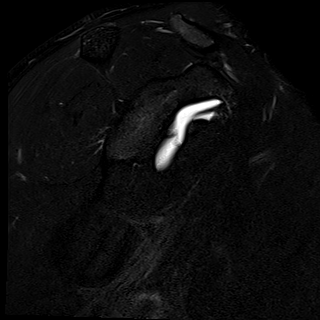
[im 16/26]
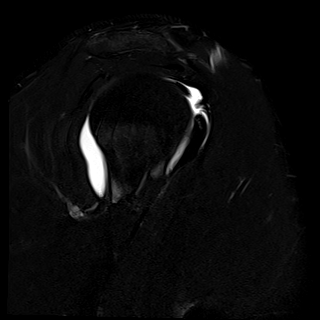
[im 21/26]
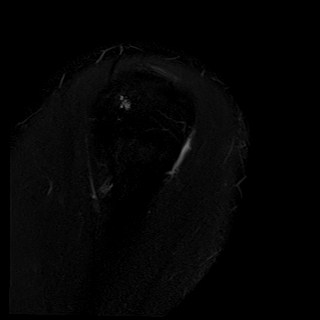
[im 26/26]
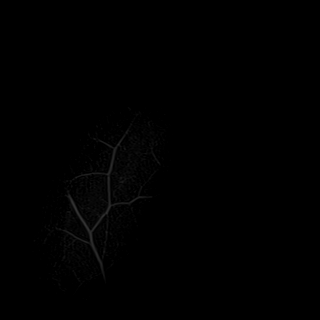

[Series 19: T1 fat-sat · sagittal · right · 4.0mm · 0.36mm/px · 5 of 26 slices shown (3 of 3)]
[im 1/26]
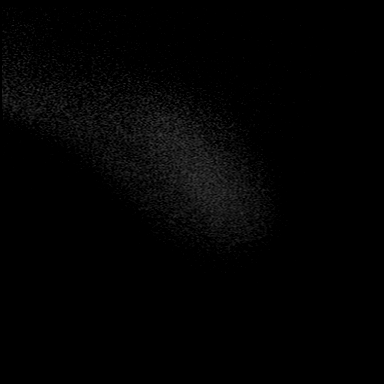
[im 6/26]
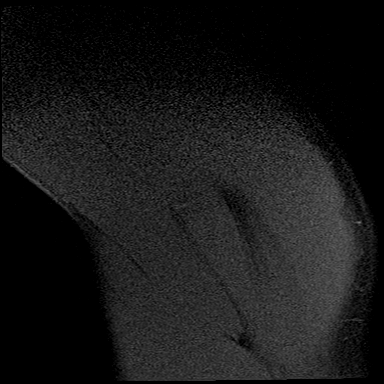
[im 11/26]
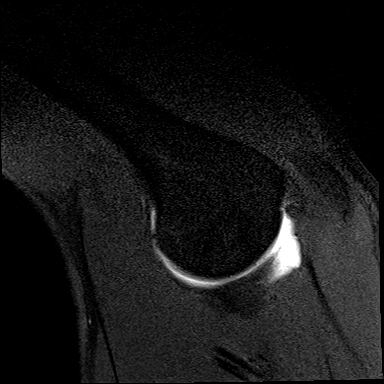
[im 16/26]
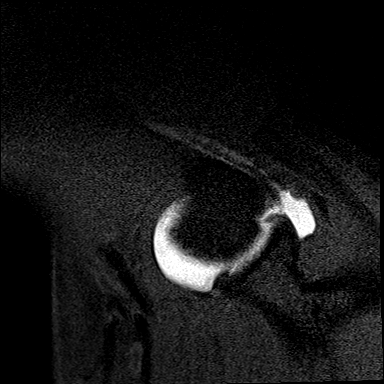
[im 21/26]
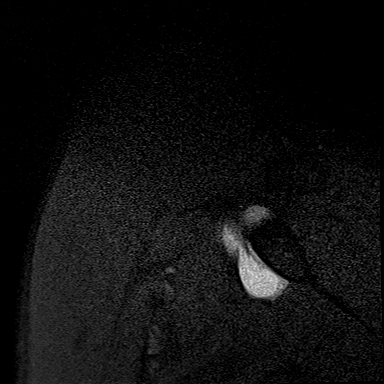

[39 of 40 positions shown; findings below may reference images not displayed]

FINDINGS: Rotator cuff: Intact and normal in appearance.

Muscles: Normal without atrophy or focal lesion.

Biceps long head: Intact and normal in appearance. The biceps
attachment to the superior labrum is intact.

Acromioclavicular Joint: Normal.  The acromion is type 2.

Glenohumeral Joint: Normal.

Labrum: Contrast extends into the substance of the superior labrum
from the level of the biceps tendon attachment posteriorly to the 10
o'clock position consistent with a type 2 SLAP tear. The patient
appears to have a sublabral foramen anterosuperiorly. Additionally,
contrast extends into the substance of the anterior, inferior labrum
compatible with tear.

Bones: No fracture or worrisome lesion. No bony Bankart or
Hill-Sachs identified.
IMPRESSION: The examination is positive for a type 2 SLAP tear. The patient also
has a tear of the anterior, inferior labrum.

Negative for Hill-Sachs lesion or bony Bankart.

Intact and normal appearing rotator cuff and long head of biceps.
